# Patient Record
Sex: Male | Born: 1975
Health system: Southern US, Community
[De-identification: ages and names within clinical notes are randomized; demographics above are authoritative.]

## PROBLEM LIST (undated history)

## (undated) DIAGNOSIS — I10 Essential (primary) hypertension: Secondary | ICD-10-CM

## (undated) DIAGNOSIS — N182 Chronic kidney disease, stage 2 (mild): Secondary | ICD-10-CM

## (undated) DIAGNOSIS — F209 Schizophrenia, unspecified: Secondary | ICD-10-CM

## (undated) DIAGNOSIS — E7800 Pure hypercholesterolemia, unspecified: Secondary | ICD-10-CM

## (undated) DIAGNOSIS — R7303 Prediabetes: Secondary | ICD-10-CM

## (undated) DIAGNOSIS — E669 Obesity, unspecified: Secondary | ICD-10-CM

## (undated) HISTORY — DX: Obesity, unspecified: E66.9

## (undated) HISTORY — PX: ANKLE SURGERY: SHX546

## (undated) HISTORY — DX: Prediabetes: R73.03

## (undated) HISTORY — DX: Pure hypercholesterolemia, unspecified: E78.00

## (undated) HISTORY — PX: TESTICLE REMOVAL: SHX68

## (undated) HISTORY — DX: Chronic kidney disease, stage 2 (mild): N18.2

---

## 1997-04-30 ENCOUNTER — Ambulatory Visit (HOSPITAL_COMMUNITY): Admission: RE | Admit: 1997-04-30 | Discharge: 1997-04-30 | Payer: Self-pay | Admitting: Family Medicine

## 1999-05-27 ENCOUNTER — Encounter: Payer: Self-pay | Admitting: Emergency Medicine

## 1999-05-27 ENCOUNTER — Emergency Department (HOSPITAL_COMMUNITY): Admission: EM | Admit: 1999-05-27 | Discharge: 1999-05-27 | Payer: Self-pay | Admitting: Emergency Medicine

## 1999-11-21 ENCOUNTER — Emergency Department (HOSPITAL_COMMUNITY): Admission: EM | Admit: 1999-11-21 | Discharge: 1999-11-21 | Payer: Self-pay | Admitting: Emergency Medicine

## 2001-02-27 ENCOUNTER — Inpatient Hospital Stay (HOSPITAL_COMMUNITY): Admission: EM | Admit: 2001-02-27 | Discharge: 2001-03-03 | Payer: Self-pay | Admitting: Psychiatry

## 2009-02-17 ENCOUNTER — Emergency Department (HOSPITAL_COMMUNITY): Admission: EM | Admit: 2009-02-17 | Discharge: 2009-02-17 | Payer: Self-pay | Admitting: Emergency Medicine

## 2009-04-13 ENCOUNTER — Emergency Department (HOSPITAL_COMMUNITY): Admission: EM | Admit: 2009-04-13 | Discharge: 2009-04-13 | Payer: Self-pay | Admitting: Emergency Medicine

## 2010-04-13 LAB — BASIC METABOLIC PANEL
BUN: 6 mg/dL (ref 6–23)
CO2: 30 mEq/L (ref 19–32)
Calcium: 9.2 mg/dL (ref 8.4–10.5)
Chloride: 103 mEq/L (ref 96–112)
Creatinine, Ser: 0.99 mg/dL (ref 0.4–1.5)
GFR calc Af Amer: >60 mL/min (ref >60)
GFR calc non Af Amer: >60 mL/min (ref >60)
Glucose, Bld: 132 mg/dL — ABNORMAL HIGH (ref 70–99)
Potassium: 3.2 mEq/L — ABNORMAL LOW (ref 3.5–5.1)
Sodium: 142 mEq/L (ref 135–145)

## 2010-04-20 LAB — POCT I-STAT, CHEM 8
BUN: 7 mg/dL (ref 6–23)
Calcium, Ion: 1.06 mmol/L — ABNORMAL LOW (ref 1.12–1.32)
Chloride: 106 mEq/L (ref 96–112)
Creatinine, Ser: 1 mg/dL (ref 0.4–1.5)
Glucose, Bld: 98 mg/dL (ref 70–99)
HCT: 51 % (ref 39.0–52.0)
Hemoglobin: 17.3 g/dL — ABNORMAL HIGH (ref 13.0–17.0)
Potassium: 3.3 mEq/L — ABNORMAL LOW (ref 3.5–5.1)
Sodium: 141 mEq/L (ref 135–145)
TCO2: 26 mmol/L (ref 0–100)
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
## 2012-03-26 ENCOUNTER — Emergency Department (HOSPITAL_COMMUNITY)
Admission: EM | Admit: 2012-03-26 | Discharge: 2012-03-27 | Disposition: A | Discharge status: Home / Self Care | Payer: Medicare Other | Attending: Emergency Medicine | Admitting: Emergency Medicine

## 2012-03-26 ENCOUNTER — Encounter (HOSPITAL_COMMUNITY): Payer: Self-pay | Admitting: Emergency Medicine

## 2012-03-26 DIAGNOSIS — F419 Anxiety disorder, unspecified: Secondary | ICD-10-CM

## 2012-03-26 DIAGNOSIS — R109 Unspecified abdominal pain: Secondary | ICD-10-CM | POA: Insufficient documentation

## 2012-03-26 DIAGNOSIS — F411 Generalized anxiety disorder: Secondary | ICD-10-CM | POA: Insufficient documentation

## 2012-03-26 DIAGNOSIS — F209 Schizophrenia, unspecified: Secondary | ICD-10-CM | POA: Insufficient documentation

## 2012-03-26 DIAGNOSIS — Z79899 Other long term (current) drug therapy: Secondary | ICD-10-CM | POA: Insufficient documentation

## 2012-03-26 HISTORY — DX: Schizophrenia, unspecified: F20.9

## 2012-03-26 NOTE — ED Notes (Signed)
Pt sts has felt like like has a upset stomach when he ate some food with meat, given the pt is a vegetarian. Pt sts another [facility] client made him agitated.VSS

## 2012-03-27 NOTE — ED Provider Notes (Signed)
History     CSN: [id number]  Arrival date & time 03/26/12  2312   First MD Initiated Contact with Patient 03/26/12 2331      Chief Complaint  Patient presents with  . Agitation    (Consider location/radiation/quality/duration/timing/severity/associated sxs/prior treatment) HPI... complains of agitation and anxiety at the [employer].  Another client had made him agitated. slight upset stomach, otherwise no other somatic complaints.  Nothing makes symptoms better or worse. Severity is mild. No radiation.  Past Medical History  Diagnosis Date  . Schizophrenia     History reviewed. No pertinent past surgical history.  No family history on file.  History  Substance Use Topics  . Smoking status: Never Smoker   . Smokeless tobacco: Not on file  . Alcohol Use: No      Review of Systems  All other systems reviewed and are negative.    Allergies  Review of patient's allergies indicates no known allergies.  Home Medications   Current Outpatient Rx  Name  Route  Sig  Dispense  Refill  . amLODipine (NORVASC) 10 MG tablet   Oral   Take 10 mg by mouth daily.         . hydrochlorothiazide (HYDRODIURIL) 25 MG tablet   Oral   Take 25 mg by mouth daily.         Marland Kitchen lisinopril (PRINIVIL,ZESTRIL) 20 MG tablet   Oral   Take 20 mg by mouth daily.         . simvastatin (ZOCOR) 40 MG tablet   Oral   Take 40 mg by mouth every evening.           BP 124/77  Pulse 67  Temp(Src) 98.6 F (37 C) (Oral)  Resp 18  SpO2 100%  Physical Exam  Nursing note and vitals reviewed. Constitutional: He is oriented to person, place, and time. He appears well-developed and well-nourished.  HENT:  Head: Normocephalic and atraumatic.  Eyes: Conjunctivae and EOM are normal. Pupils are equal, round, and reactive to light.  Neck: Normal range of motion. Neck supple.  Cardiovascular: Normal rate, regular rhythm and normal heart sounds.   Pulmonary/Chest: Effort normal and  breath sounds normal.  Abdominal: Soft. Bowel sounds are normal.  Musculoskeletal: Normal range of motion.  Neurological: He is alert and oriented to person, place, and time.  Skin: Skin is warm and dry.  Psychiatric: He has a normal mood and affect.    ED Course  Procedures (including critical care time)  Labs Reviewed - No data to display No results found.   1. Anxiety       MDM  Normal exam. No further testing necessary.  Talking session        Donnetta Hutching, MD 03/27/12 [phone]

## 2013-02-28 DIAGNOSIS — I10 Essential (primary) hypertension: Secondary | ICD-10-CM | POA: Diagnosis not present

## 2013-03-07 DIAGNOSIS — I10 Essential (primary) hypertension: Secondary | ICD-10-CM | POA: Diagnosis not present

## 2013-03-07 DIAGNOSIS — E785 Hyperlipidemia, unspecified: Secondary | ICD-10-CM | POA: Diagnosis not present

## 2013-06-04 DIAGNOSIS — F209 Schizophrenia, unspecified: Secondary | ICD-10-CM | POA: Diagnosis not present

## 2013-07-11 DIAGNOSIS — F209 Schizophrenia, unspecified: Secondary | ICD-10-CM | POA: Diagnosis not present

## 2013-08-08 DIAGNOSIS — F209 Schizophrenia, unspecified: Secondary | ICD-10-CM | POA: Diagnosis not present

## 2013-08-28 DIAGNOSIS — Z7189 Other specified counseling: Secondary | ICD-10-CM | POA: Diagnosis not present

## 2013-08-28 DIAGNOSIS — E119 Type 2 diabetes mellitus without complications: Secondary | ICD-10-CM | POA: Diagnosis not present

## 2013-08-28 DIAGNOSIS — N529 Male erectile dysfunction, unspecified: Secondary | ICD-10-CM | POA: Diagnosis not present

## 2013-08-28 DIAGNOSIS — I10 Essential (primary) hypertension: Secondary | ICD-10-CM | POA: Diagnosis not present

## 2013-10-03 DIAGNOSIS — F209 Schizophrenia, unspecified: Secondary | ICD-10-CM | POA: Diagnosis not present

## 2013-12-31 DIAGNOSIS — F209 Schizophrenia, unspecified: Secondary | ICD-10-CM | POA: Diagnosis not present

## 2014-01-01 DIAGNOSIS — I10 Essential (primary) hypertension: Secondary | ICD-10-CM | POA: Diagnosis not present

## 2014-01-01 DIAGNOSIS — E785 Hyperlipidemia, unspecified: Secondary | ICD-10-CM | POA: Diagnosis not present

## 2014-01-09 ENCOUNTER — Emergency Department (HOSPITAL_COMMUNITY)
Admission: EM | Admit: 2014-01-09 | Discharge: 2014-01-09 | Disposition: A | Discharge status: Home / Self Care | Payer: Medicare Other | Attending: Emergency Medicine | Admitting: Emergency Medicine

## 2014-01-09 ENCOUNTER — Encounter (HOSPITAL_COMMUNITY): Payer: Self-pay | Admitting: Emergency Medicine

## 2014-01-09 ENCOUNTER — Emergency Department (HOSPITAL_COMMUNITY): Payer: Medicare Other

## 2014-01-09 DIAGNOSIS — Q550 Absence and aplasia of testis: Secondary | ICD-10-CM | POA: Diagnosis not present

## 2014-01-09 DIAGNOSIS — F419 Anxiety disorder, unspecified: Secondary | ICD-10-CM | POA: Insufficient documentation

## 2014-01-09 DIAGNOSIS — I10 Essential (primary) hypertension: Secondary | ICD-10-CM | POA: Diagnosis not present

## 2014-01-09 DIAGNOSIS — K4090 Unilateral inguinal hernia, without obstruction or gangrene, not specified as recurrent: Secondary | ICD-10-CM | POA: Diagnosis not present

## 2014-01-09 DIAGNOSIS — Z79899 Other long term (current) drug therapy: Secondary | ICD-10-CM | POA: Diagnosis not present

## 2014-01-09 DIAGNOSIS — R350 Frequency of micturition: Secondary | ICD-10-CM | POA: Diagnosis not present

## 2014-01-09 DIAGNOSIS — N508 Other specified disorders of male genital organs: Secondary | ICD-10-CM | POA: Diagnosis not present

## 2014-01-09 DIAGNOSIS — N50819 Testicular pain, unspecified: Secondary | ICD-10-CM

## 2014-01-09 DIAGNOSIS — R202 Paresthesia of skin: Secondary | ICD-10-CM | POA: Diagnosis not present

## 2014-01-09 HISTORY — DX: Essential (primary) hypertension: I10

## 2014-01-09 LAB — BASIC METABOLIC PANEL
Anion gap: 14 (ref 5–15)
BUN: 12 mg/dL (ref 6–23)
CALCIUM: 10 mg/dL (ref 8.4–10.5)
CO2: 26 meq/L (ref 19–32)
Chloride: 100 mEq/L (ref 96–112)
Creatinine, Ser: 1.04 mg/dL (ref 0.50–1.35)
GFR calc Af Amer: >90 mL/min (ref >90)
GFR calc non Af Amer: >90 mL/min (ref >90)
GLUCOSE: 96 mg/dL (ref 70–99)
POTASSIUM: 3.7 meq/L (ref 3.7–5.3)
Sodium: 140 mEq/L (ref 137–147)

## 2014-01-09 LAB — CBC WITH DIFFERENTIAL/PLATELET
Basophils Absolute: 0 10*3/uL (ref 0.0–0.1)
Basophils Relative: 0 % (ref 0–1)
EOS PCT: 2 % (ref 0–5)
Eosinophils Absolute: 0.1 10*3/uL (ref 0.0–0.7)
HCT: 47.3 % (ref 39.0–52.0)
HEMOGLOBIN: 15.8 g/dL (ref 13.0–17.0)
LYMPHS ABS: 2.6 10*3/uL (ref 0.7–4.0)
LYMPHS PCT: 46 % (ref 12–46)
MCH: 29.7 pg (ref 26.0–34.0)
MCHC: 33.4 g/dL (ref 30.0–36.0)
MCV: 88.9 fL (ref 78.0–100.0)
Monocytes Absolute: 0.5 10*3/uL (ref 0.1–1.0)
Monocytes Relative: 9 % (ref 3–12)
Neutro Abs: 2.5 10*3/uL (ref 1.7–7.7)
Neutrophils Relative %: 43 % (ref 43–77)
Platelets: 249 10*3/uL (ref 150–400)
RBC: 5.32 MIL/uL (ref 4.22–5.81)
RDW: 13.8 % (ref 11.5–15.5)
WBC: 5.8 10*3/uL (ref 4.0–10.5)

## 2014-01-09 MED ORDER — SODIUM CHLORIDE 0.9 % IV BOLUS (SEPSIS)
1000.0000 mL | Freq: Once | INTRAVENOUS | Status: AC
  Administered 2014-01-09: 1000 mL via INTRAVENOUS

## 2014-01-09 MED ORDER — MORPHINE SULFATE 4 MG/ML IJ SOLN
4.0000 mg | Freq: Once | INTRAMUSCULAR | Status: AC
  Administered 2014-01-09: 4 mg via INTRAVENOUS

## 2014-01-09 MED ORDER — MORPHINE SULFATE 4 MG/ML IJ SOLN
4.0000 mg | Freq: Once | INTRAMUSCULAR | Status: DC
  Filled 2014-01-09: qty 1

## 2014-01-09 NOTE — Discharge Instructions (Signed)
PLEASE DRIVE YOURSELF OVER TO DR. DAHLSTEAD'S OFFICE IMMEDIATELY TO BE SEEN. Please read all discharge instructions and return precautions.   Testicular Torsion Testicular torsion is a twisting of the spermatic cord, artery, and vein that go to the testicle. This twisting cuts off the blood supply to everything in the sac that contains the testes, blood vessels, and part of the spermatic cord (scrotum). Testicular torsion is most commonly seen in newborn and adolescent males. It can also occur before birth. Testicular torsion requires emergency treatment. The testicle usually can be saved if the torsion is treated within 6 hours of onset. If the torsion is left untreated for too long, the testicle will die and have to be removed.  CAUSES  Torsion can be caused by a hit on the scrotum or by certain movements during exercise. In some males, testicular torsion is more common because the connection of their testicle to a specific tissue in their scrotum developed in the wrong place, allowing the testicle to rotate and the cord to get twisted.  SIGNS AND SYMPTOMS  The main symptom of testicular torsion is pain in your testicle. The scrotum may be swollen, red, hard, and very tender. There will be excess fluid in the tissue (edema).The testicle may be higher than normal in the scrotum. The skin of the scrotum may be stuck to the testicle. You may have nausea, vomiting, and a fever. DIAGNOSIS  Often testicular torsion is diagnosed through a physical exam. Sometimes imaging exams and tests to measure blood flow may be done. TREATMENT  A manual untwisting of the testicle may be done when the testicle is still mobile and the maneuver is not too painful. However, surgery usually is necessary and should be done as soon as possible after torsion occurs. During surgery, the testicle is untwisted and evaluated and possibly removed.  Document Released: 01/11/2005 Document Revised: 01/16/2013 Document Reviewed:  07/03/2012 [facility] Patient Information 2015 [employer] [place]. This information is not intended to replace advice given to you by your health care provider. Make sure you discuss any questions you have with your health care provider.

## 2014-01-09 NOTE — ED Notes (Signed)
Pt transported to US

## 2014-01-09 NOTE — ED Provider Notes (Signed)
CSN: [id number]     Arrival date & time 01/09/14  0810 History   First MD Initiated Contact with Patient 01/09/14 [phone]     Chief Complaint  Patient presents with  . Testicle Pain     (Consider location/radiation/quality/duration/timing/severity/associated sxs/prior Treatment) HPI Comments: Patient is a 38 year old male past medical history significant for schizophrenia, hypertension presenting to the emergency department complaining of left testicular pain. He states began around 7:45 AM this morning after doing pushups. He states it felt like his testicle "went back inside of him." He states he took a shower to try to help the pain with no improvement. He states he had his right testicle removed 20 years ago and is concerned this may happen to his left testicle. No modifying factors identified. He does endorse urinary frequency, denies any dysuria, hematuria, penile discharge, recent unprotected sexual intercourse or concern for sexual transmitted diseases.  Patient is a 38 y.o. male presenting with testicular pain.  Testicle Pain    Past Medical History  Diagnosis Date  . Schizophrenia   . Hypertension    Past Surgical History  Procedure Laterality Date  . Testicle removal      Right  . Ankle surgery      left   No family history on file. History  Substance Use Topics  . Smoking status: Never Smoker   . Smokeless tobacco: Not on file  . Alcohol Use: No    Review of Systems  Genitourinary: Positive for frequency and testicular pain.  All other systems reviewed and are negative.     Allergies  Review of patient's allergies indicates no known allergies.  Home Medications   Prior to Admission medications   Medication Sig Start Date End Date Taking? Authorizing Provider  amLODipine (NORVASC) 10 MG tablet Take 10 mg by mouth daily.    Historical Provider, MD  hydrochlorothiazide (HYDRODIURIL) 25 MG tablet Take 25 mg by mouth daily.    Historical Provider, MD   lisinopril (PRINIVIL,ZESTRIL) 20 MG tablet Take 20 mg by mouth daily.    Historical Provider, MD  simvastatin (ZOCOR) 40 MG tablet Take 40 mg by mouth every evening.    Historical Provider, MD   BP 127/78 mmHg  Pulse 87  Temp(Src) 99.1 F (37.3 C) (Oral)  Resp 24  Ht 5\' 8"  (1.727 m)  Wt 240 lb (108.863 kg)  BMI 36.50 kg/m2  SpO2 100% Physical Exam  Constitutional: He is oriented to person, place, and time. He appears well-developed and well-nourished. No distress.  HENT:  Head: Normocephalic and atraumatic.  Right Ear: External ear normal.  Left Ear: External ear normal.  Nose: Nose normal.  Mouth/Throat: Oropharynx is clear and moist.  Eyes: Conjunctivae are normal.  Neck: Normal range of motion. Neck supple.  Cardiovascular: Normal rate, regular rhythm and normal heart sounds.   Pulmonary/Chest: Effort normal and breath sounds normal.  Abdominal: Soft.  Genitourinary: Penis normal. Left testis shows no mass, no swelling and no tenderness. Left testis is descended. Circumcised.  Right testicle absent (s/p removal)  Musculoskeletal: Normal range of motion.  Lymphadenopathy:       Right: No inguinal adenopathy present.       Left: No inguinal adenopathy present.  Neurological: He is alert and oriented to person, place, and time.  Skin: Skin is warm and dry. He is not diaphoretic.  Psychiatric: His mood appears anxious.  Nursing note and vitals reviewed.   ED Course  Procedures (including critical care time) Medications  morphine 4  MG/ML injection 4 mg (4 mg Intravenous Given 01/09/14 0827)    Labs Review Labs Reviewed - No data to display  Imaging Review No results found.   EKG Interpretation None      10:11 AM Discussed patient with Dr. Lenn Sinkahlsteadt who will see patient in his office today. Will send patient to his office.    MDM   Final diagnoses:  Testicular pain    Filed Vitals:   01/09/14 1025  BP: 153/87  Pulse: 84  Temp: 98.9 F (37.2 C)   Resp: 20   Afebrile, NAD, non-toxic appearing, AAOx4.  I have reviewed nursing notes, vital signs, and all appropriate lab and imaging results for this patient. Symptoms controlled in the ED. Patient discharged to [facility] for elevation of possible torsion de-torsion of his left testicle given history, physical examination. Patient is agreeable to this plan. Patient d/w with Dr. Patria Maneampos, agrees with plan.      Jeannetta EllisJennifer L Kindell Strada, PA-C 01/09/14 1601  Lyanne CoKevin M Campos, MD 01/09/14 1630

## 2014-01-09 NOTE — ED Notes (Signed)
EMS - Patient is coming from home with c/o of left testicular pain.  Pain started around 07:45 after doing push ups.

## 2014-01-09 NOTE — ED Notes (Signed)
Patient verbalized understanding of discharge instructions and the need for immediate follow up care after discharge to urology.

## 2014-01-09 NOTE — ED Notes (Signed)
This RN had to go to Ultrasound and assist staff in getting patient to respond and provide verbal consent for ultrasound examination.  Patient acknowledged this RN and verbalized understanding of the procedure with US technician at bedside.  Patient is alert and responding to verbal command.

## 2014-01-29 DIAGNOSIS — F209 Schizophrenia, unspecified: Secondary | ICD-10-CM | POA: Diagnosis not present

## 2014-02-21 DIAGNOSIS — F209 Schizophrenia, unspecified: Secondary | ICD-10-CM | POA: Diagnosis not present

## 2014-04-17 DIAGNOSIS — Z789 Other specified health status: Secondary | ICD-10-CM | POA: Diagnosis not present

## 2014-04-17 DIAGNOSIS — N521 Erectile dysfunction due to diseases classified elsewhere: Secondary | ICD-10-CM | POA: Diagnosis not present

## 2014-04-17 DIAGNOSIS — I10 Essential (primary) hypertension: Secondary | ICD-10-CM | POA: Diagnosis not present

## 2014-04-18 DIAGNOSIS — F209 Schizophrenia, unspecified: Secondary | ICD-10-CM | POA: Diagnosis not present

## 2014-07-09 DIAGNOSIS — F209 Schizophrenia, unspecified: Secondary | ICD-10-CM | POA: Diagnosis not present

## 2014-07-09 DIAGNOSIS — I10 Essential (primary) hypertension: Secondary | ICD-10-CM | POA: Diagnosis not present

## 2014-07-09 DIAGNOSIS — E780 Pure hypercholesterolemia: Secondary | ICD-10-CM | POA: Diagnosis not present

## 2014-08-27 DIAGNOSIS — I10 Essential (primary) hypertension: Secondary | ICD-10-CM | POA: Diagnosis not present

## 2014-08-27 DIAGNOSIS — F209 Schizophrenia, unspecified: Secondary | ICD-10-CM | POA: Diagnosis not present

## 2014-08-27 DIAGNOSIS — Z1389 Encounter for screening for other disorder: Secondary | ICD-10-CM | POA: Diagnosis not present

## 2014-08-27 DIAGNOSIS — Z0000 Encounter for general adult medical examination without abnormal findings: Secondary | ICD-10-CM | POA: Diagnosis not present

## 2014-08-27 DIAGNOSIS — E780 Pure hypercholesterolemia: Secondary | ICD-10-CM | POA: Diagnosis not present

## 2014-08-27 DIAGNOSIS — Z125 Encounter for screening for malignant neoplasm of prostate: Secondary | ICD-10-CM | POA: Diagnosis not present

## 2014-10-11 DIAGNOSIS — F209 Schizophrenia, unspecified: Secondary | ICD-10-CM | POA: Diagnosis not present

## 2014-10-13 DIAGNOSIS — Z23 Encounter for immunization: Secondary | ICD-10-CM | POA: Diagnosis not present

## 2015-01-29 DIAGNOSIS — F209 Schizophrenia, unspecified: Secondary | ICD-10-CM | POA: Diagnosis not present

## 2015-03-03 DIAGNOSIS — F209 Schizophrenia, unspecified: Secondary | ICD-10-CM | POA: Diagnosis not present

## 2015-03-03 DIAGNOSIS — Z6841 Body Mass Index (BMI) 40.0 and over, adult: Secondary | ICD-10-CM | POA: Diagnosis not present

## 2015-03-03 DIAGNOSIS — I10 Essential (primary) hypertension: Secondary | ICD-10-CM | POA: Diagnosis not present

## 2015-03-03 DIAGNOSIS — E669 Obesity, unspecified: Secondary | ICD-10-CM | POA: Diagnosis not present

## 2015-03-03 DIAGNOSIS — E7800 Pure hypercholesterolemia, unspecified: Secondary | ICD-10-CM | POA: Diagnosis not present

## 2015-04-05 IMAGING — US US SCROTUM
1 series · 13 of 23 positions shown · non-contrast
Comparison: None.

CLINICAL DATA: 38-year-old male with acute testicular pain
beginning at 8003 hr today. Personal history of right orchidectomy.
Initial encounter.

EXAM:
SCROTAL ULTRASOUND
DOPPLER ULTRASOUND OF THE TESTICLES
TECHNIQUE: Complete ultrasound examination of the testicles, epididymis, and
other scrotal structures was performed. Color and spectral Doppler
ultrasound were also utilized to evaluate blood flow to the
testicles.

[Series 1: us scrotum · 0.06mm/px · 13 of 23 slices shown]
[im 1/23]
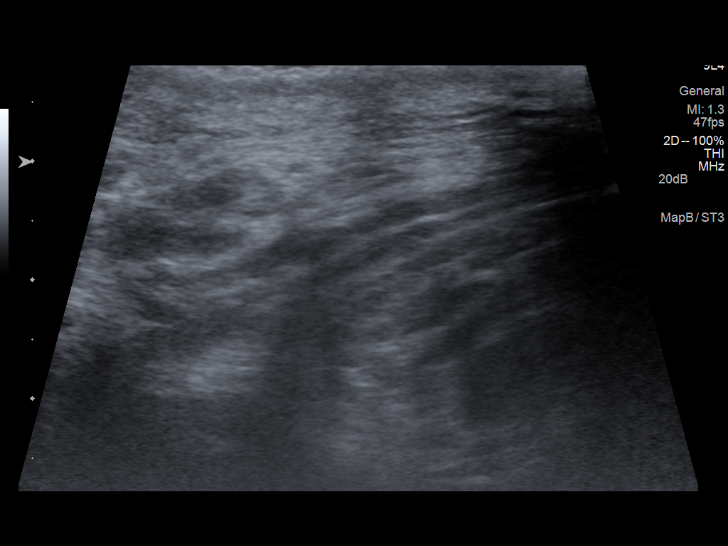
[im 3/23]
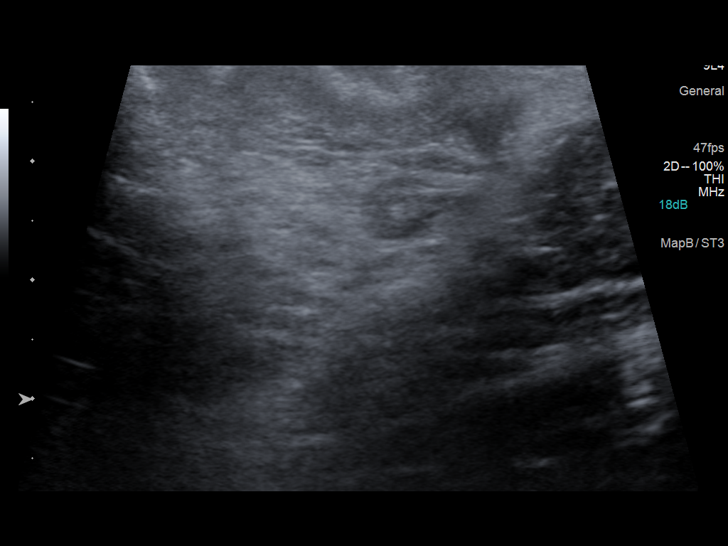
[im 5/23]
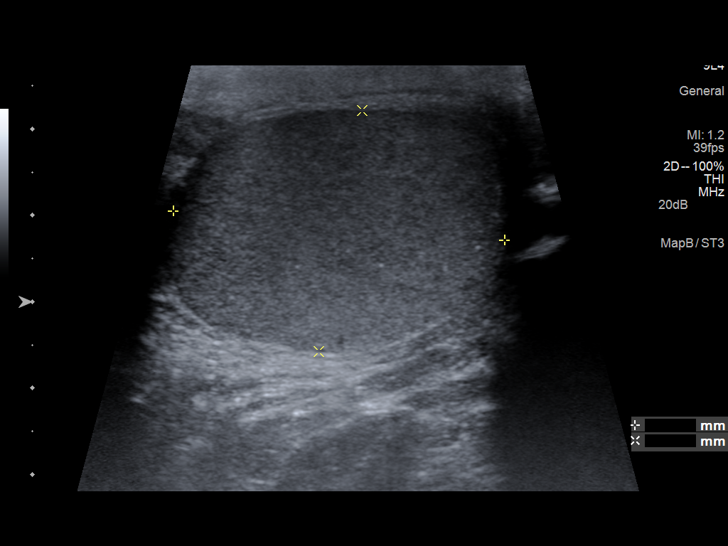
[im 7/23]
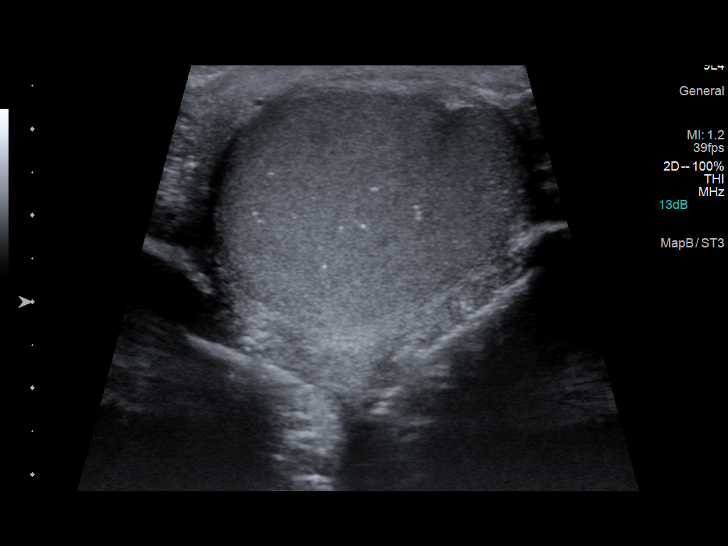
[im 8/23]
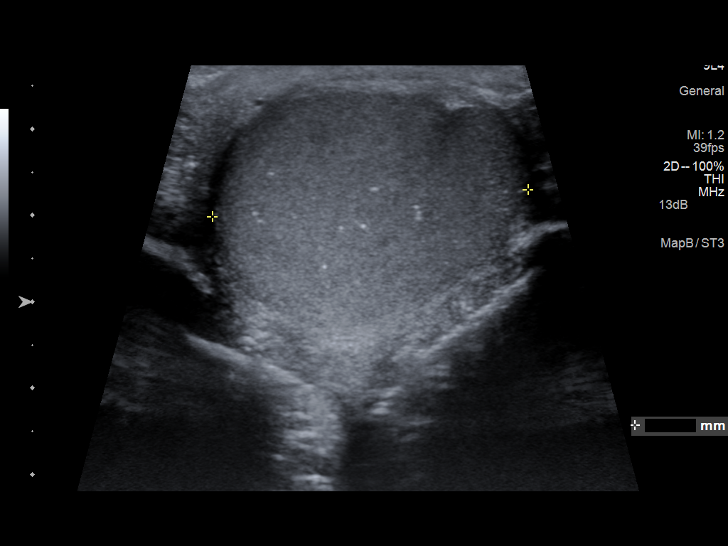
[im 10/23]
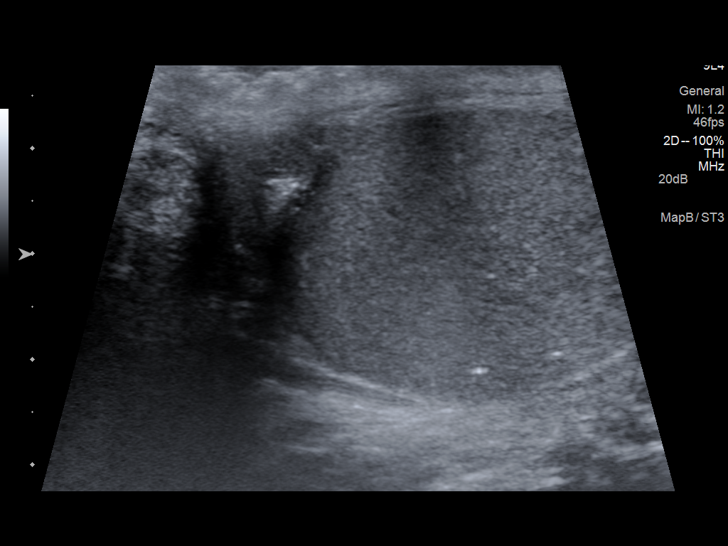
[im 12/23]
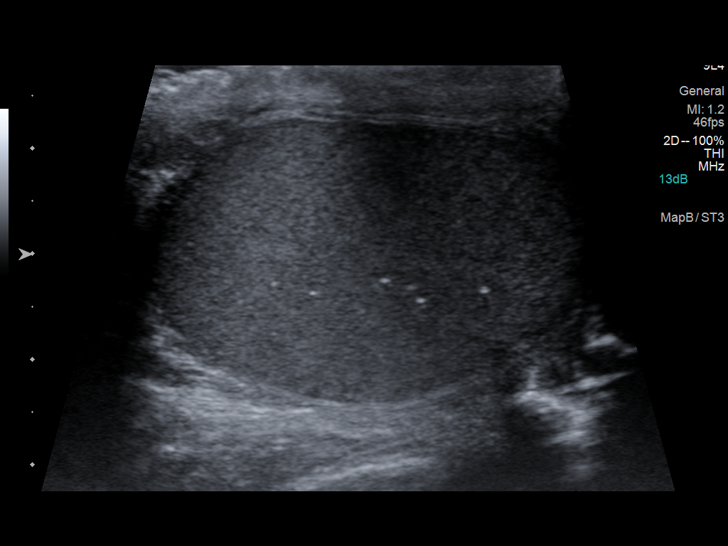
[im 14/23]
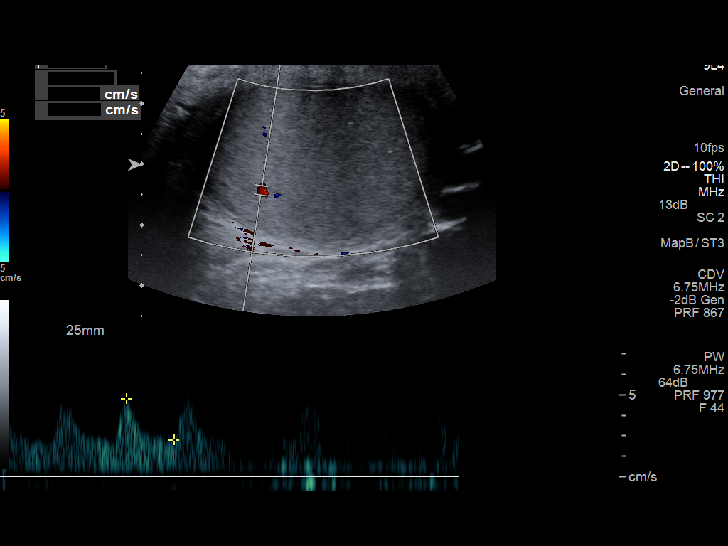
[im 16/23]
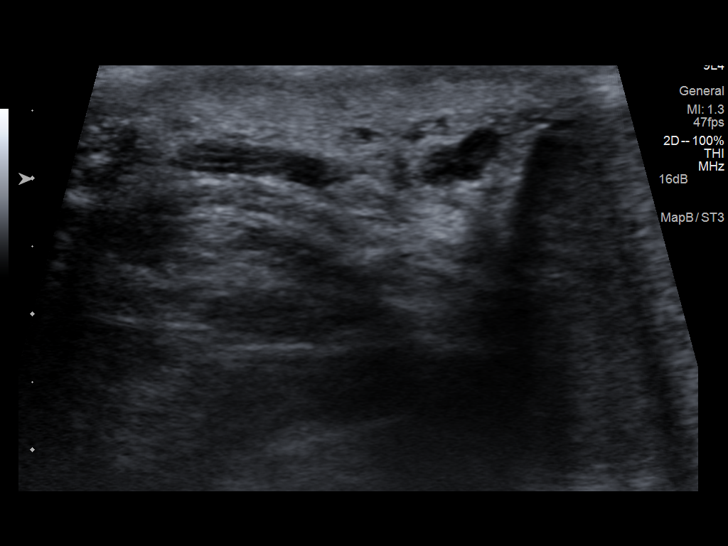
[im 17/23]
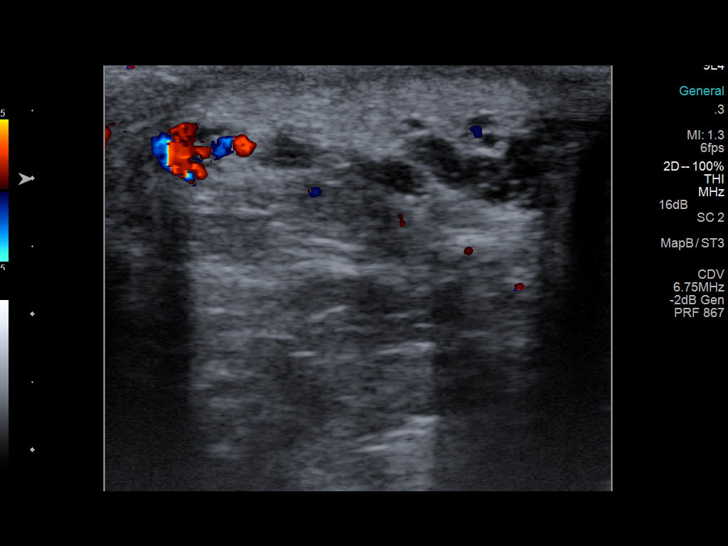
[im 19/23]
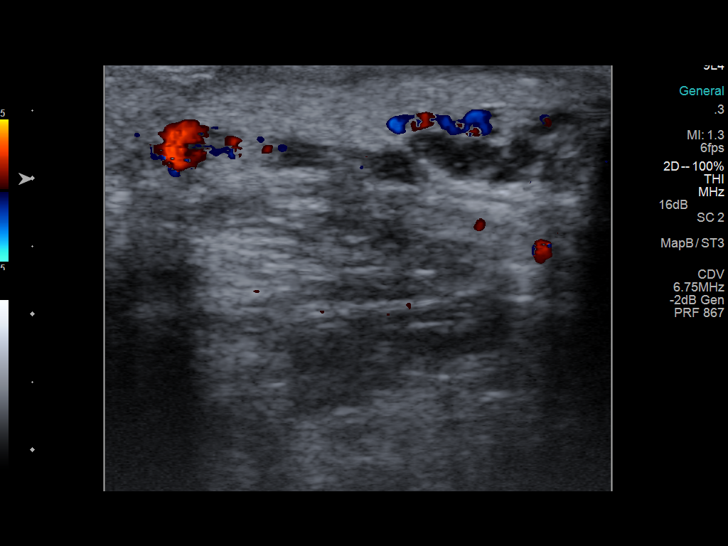
[im 21/23]
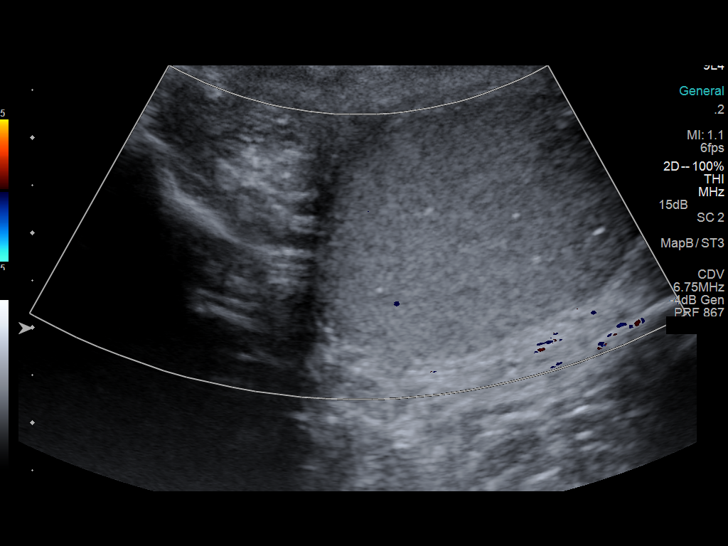
[im 23/23]
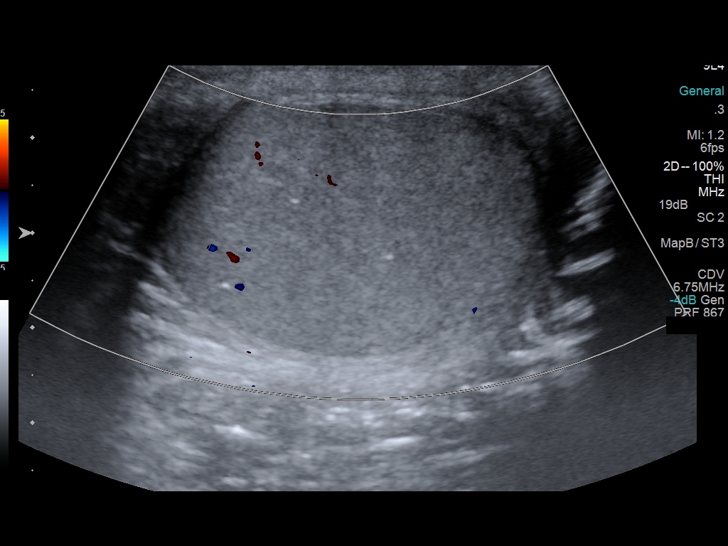

[13 of 23 positions shown; findings below may reference images not displayed]

FINDINGS: Right testicle

Measurements: Surgically absent.

Left testicle

Measurements: 3.9 x 2.8 x 3.7 cm. No testicular mass, but there is
microlithiasis.

Right epididymis:  Surgically absent

Left epididymis:  Normal in size and appearance.

Hydrocele:  None visualized.

Varicocele:  None visualized.

Pulsed Doppler interrogation of the left testis. Low resistance
arterial and venous waveforms are detected in the left testis
(images 15 and 16). However, color Doppler images of the left testis
suggest somewhat decreased vascularity (images 22 and 24)
IMPRESSION: 1. Spectral Doppler waveforms are detected in the left testis, but
color Doppler images suggest less than expected vascularity overall
(however, there is no right testicle for comparison). Consider acute
torsion - de-torsion and recommend urology consultation
2. Left microlithiasis.
3. Surgically absent right testicle.
Study discussed by telephone with PA KIVEN TENN on

## 2015-04-21 DIAGNOSIS — F209 Schizophrenia, unspecified: Secondary | ICD-10-CM | POA: Diagnosis not present

## 2015-08-06 DIAGNOSIS — F209 Schizophrenia, unspecified: Secondary | ICD-10-CM | POA: Diagnosis not present

## 2015-10-20 DIAGNOSIS — E7800 Pure hypercholesterolemia, unspecified: Secondary | ICD-10-CM | POA: Diagnosis not present

## 2015-10-20 DIAGNOSIS — Z1389 Encounter for screening for other disorder: Secondary | ICD-10-CM | POA: Diagnosis not present

## 2015-10-20 DIAGNOSIS — F209 Schizophrenia, unspecified: Secondary | ICD-10-CM | POA: Diagnosis not present

## 2015-10-20 DIAGNOSIS — Z23 Encounter for immunization: Secondary | ICD-10-CM | POA: Diagnosis not present

## 2015-10-20 DIAGNOSIS — Z6841 Body Mass Index (BMI) 40.0 and over, adult: Secondary | ICD-10-CM | POA: Diagnosis not present

## 2015-10-20 DIAGNOSIS — I10 Essential (primary) hypertension: Secondary | ICD-10-CM | POA: Diagnosis not present

## 2015-10-20 DIAGNOSIS — Z0000 Encounter for general adult medical examination without abnormal findings: Secondary | ICD-10-CM | POA: Diagnosis not present

## 2016-01-02 DIAGNOSIS — R799 Abnormal finding of blood chemistry, unspecified: Secondary | ICD-10-CM | POA: Diagnosis not present

## 2016-02-13 DIAGNOSIS — R319 Hematuria, unspecified: Secondary | ICD-10-CM | POA: Diagnosis not present

## 2016-02-19 DIAGNOSIS — F209 Schizophrenia, unspecified: Secondary | ICD-10-CM | POA: Diagnosis not present

## 2016-06-03 DIAGNOSIS — F209 Schizophrenia, unspecified: Secondary | ICD-10-CM | POA: Diagnosis not present

## 2016-06-04 DIAGNOSIS — E7800 Pure hypercholesterolemia, unspecified: Secondary | ICD-10-CM | POA: Diagnosis not present

## 2016-06-04 DIAGNOSIS — I10 Essential (primary) hypertension: Secondary | ICD-10-CM | POA: Diagnosis not present

## 2016-06-04 DIAGNOSIS — Z6841 Body Mass Index (BMI) 40.0 and over, adult: Secondary | ICD-10-CM | POA: Diagnosis not present

## 2016-06-04 DIAGNOSIS — R7309 Other abnormal glucose: Secondary | ICD-10-CM | POA: Diagnosis not present

## 2016-06-04 DIAGNOSIS — F258 Other schizoaffective disorders: Secondary | ICD-10-CM | POA: Diagnosis not present

## 2016-11-10 DIAGNOSIS — I10 Essential (primary) hypertension: Secondary | ICD-10-CM | POA: Diagnosis not present

## 2016-11-10 DIAGNOSIS — E7800 Pure hypercholesterolemia, unspecified: Secondary | ICD-10-CM | POA: Diagnosis not present

## 2016-11-10 DIAGNOSIS — R7303 Prediabetes: Secondary | ICD-10-CM | POA: Diagnosis not present

## 2016-11-10 DIAGNOSIS — F209 Schizophrenia, unspecified: Secondary | ICD-10-CM | POA: Diagnosis not present

## 2016-11-10 DIAGNOSIS — Z23 Encounter for immunization: Secondary | ICD-10-CM | POA: Diagnosis not present

## 2016-12-14 DIAGNOSIS — I10 Essential (primary) hypertension: Secondary | ICD-10-CM | POA: Diagnosis not present

## 2016-12-14 DIAGNOSIS — E785 Hyperlipidemia, unspecified: Secondary | ICD-10-CM | POA: Diagnosis not present

## 2016-12-14 DIAGNOSIS — F29 Unspecified psychosis not due to a substance or known physiological condition: Secondary | ICD-10-CM | POA: Diagnosis not present

## 2016-12-14 DIAGNOSIS — F209 Schizophrenia, unspecified: Secondary | ICD-10-CM | POA: Diagnosis present

## 2016-12-14 DIAGNOSIS — F2089 Other schizophrenia: Secondary | ICD-10-CM | POA: Diagnosis not present

## 2016-12-14 DIAGNOSIS — E876 Hypokalemia: Secondary | ICD-10-CM | POA: Diagnosis not present

## 2016-12-14 DIAGNOSIS — Z9114 Patient's other noncompliance with medication regimen: Secondary | ICD-10-CM | POA: Diagnosis not present

## 2016-12-14 DIAGNOSIS — Z590 Homelessness: Secondary | ICD-10-CM | POA: Diagnosis not present

## 2016-12-14 DIAGNOSIS — E538 Deficiency of other specified B group vitamins: Secondary | ICD-10-CM | POA: Diagnosis present

## 2017-02-22 DIAGNOSIS — F209 Schizophrenia, unspecified: Secondary | ICD-10-CM | POA: Diagnosis not present

## 2017-02-25 DIAGNOSIS — E7800 Pure hypercholesterolemia, unspecified: Secondary | ICD-10-CM | POA: Diagnosis not present

## 2017-02-25 DIAGNOSIS — I10 Essential (primary) hypertension: Secondary | ICD-10-CM | POA: Diagnosis not present

## 2017-02-25 DIAGNOSIS — R7303 Prediabetes: Secondary | ICD-10-CM | POA: Diagnosis not present

## 2017-02-25 DIAGNOSIS — Z1389 Encounter for screening for other disorder: Secondary | ICD-10-CM | POA: Diagnosis not present

## 2017-02-25 DIAGNOSIS — F209 Schizophrenia, unspecified: Secondary | ICD-10-CM | POA: Diagnosis not present

## 2017-03-17 DIAGNOSIS — F209 Schizophrenia, unspecified: Secondary | ICD-10-CM | POA: Diagnosis not present

## 2017-05-13 DIAGNOSIS — F209 Schizophrenia, unspecified: Secondary | ICD-10-CM | POA: Diagnosis not present

## 2017-09-07 ENCOUNTER — Ambulatory Visit: Payer: Medicare Other | Admitting: Podiatry

## 2017-09-14 ENCOUNTER — Encounter: Payer: Self-pay | Admitting: Podiatry

## 2017-09-14 ENCOUNTER — Ambulatory Visit (INDEPENDENT_AMBULATORY_CARE_PROVIDER_SITE_OTHER): Payer: Medicare Other | Admitting: Podiatry

## 2017-09-14 VITALS — BP 141/92 | HR 100

## 2017-09-14 DIAGNOSIS — B351 Tinea unguium: Secondary | ICD-10-CM | POA: Diagnosis not present

## 2017-09-14 MED ORDER — TERBINAFINE HCL 250 MG PO TABS: 250.0000 mg | ORAL_TABLET | Freq: Every day | ORAL | 0 refills | Status: DC

## 2017-09-18 NOTE — Progress Notes (Signed)
   Subjective: 42 year old male presenting today as a new patient with a chief complaint of nail fungus of the first and second toes of bilateral feet that has been present for the past 3-5 years. He states he has tried several OTC products with no relief of the symptoms. He denies modifying factors. Patient is here for further evaluation and treatment.   Past Medical History:  Diagnosis Date  . Hypertension   . Schizophrenia (HCC)     Objective: Physical Exam General: The patient is alert and oriented x3 in no acute distress.  Dermatology: Hyperkeratotic, discolored, thickened, onychodystrophy of the first and second toenails noted bilaterally. Skin is warm, dry and supple bilateral lower extremities. Negative for open lesions or macerations.  Vascular: Palpable pedal pulses bilaterally. No edema or erythema noted. Capillary refill within normal limits.  Neurological: Epicritic and protective threshold grossly intact bilaterally.   Musculoskeletal Exam: Range of motion within normal limits to all pedal and ankle joints bilateral. Muscle strength 5/5 in all groups bilateral.   Assessment: #1 Onychomycosis 1st and 2nd toenails bilaterally   Plan of Care:  #1 Patient was evaluated. #2 Prescription for Lamisil 250 mg #90 provided to patient.  #3 Appointment with Shanda BumpsJessica for laser treatment.  #4 Return to clinic as needed.    Felecia ShellingBrent M. Darlin Stenseth, DPM [facility]  Dr. Felecia ShellingBrent M. Chrisanne Loose, DPM    [address]                                        [place], [place] [address]                Office [phone]  Fax [phone]

## 2017-10-19 DIAGNOSIS — F209 Schizophrenia, unspecified: Secondary | ICD-10-CM | POA: Diagnosis not present

## 2017-10-22 ENCOUNTER — Other Ambulatory Visit: Payer: Self-pay

## 2017-10-22 ENCOUNTER — Emergency Department (HOSPITAL_COMMUNITY)
Admission: EM | Admit: 2017-10-22 | Discharge: 2017-10-23 | Disposition: A | Discharge status: Home / Self Care | Payer: Medicare Other | Attending: Emergency Medicine | Admitting: Emergency Medicine

## 2017-10-22 DIAGNOSIS — M79642 Pain in left hand: Secondary | ICD-10-CM | POA: Insufficient documentation

## 2017-10-22 DIAGNOSIS — Z5321 Procedure and treatment not carried out due to patient leaving prior to being seen by health care provider: Secondary | ICD-10-CM | POA: Insufficient documentation
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
## 2018-12-08 ENCOUNTER — Ambulatory Visit: Payer: Medicare Other | Admitting: Cardiology

## 2018-12-08 NOTE — Progress Notes (Deleted)
Cardiology Office Note:    Date:  12/08/2018   ID:  Pam Drown, DOB 1975/07/10, MRN [id number]  PCP:  Georgann Housekeeper, MD  Cardiologist:  No primary care provider on file.  Electrophysiologist:  None   Referring MD: Georgann Housekeeper, MD   No chief complaint on file. ***  History of Present Illness:    Eddie Simmons is a 43 y.o. male with a hx of ***  Past Medical History:  Diagnosis Date  . Hypertension   . Schizophrenia [facility])     Past Surgical History:  Procedure Laterality Date  . ANKLE SURGERY     left  . TESTICLE REMOVAL     Right    Current Medications: No outpatient medications have been marked as taking for the 12/08/18 encounter (Appointment) with Jake Bathe, MD.     Allergies:   Patient has no known allergies.   Social History   Socioeconomic History  . Marital status: Single    Spouse name: Not on file  . Number of children: Not on file  . Years of education: Not on file  . Highest education level: Not on file  Occupational History  . Not on file  Social Needs  . Financial resource strain: Not on file  . Food insecurity    Worry: Not on file    Inability: Not on file  . Transportation needs    Medical: Not on file    Non-medical: Not on file  Tobacco Use  . Smoking status: Never Smoker  . Smokeless tobacco: Never Used  Substance and Sexual Activity  . Alcohol use: No  . Drug use: No  . Sexual activity: Not on file  Lifestyle  . Physical activity    Days per week: Not on file    Minutes per session: Not on file  . Stress: Not on file  Relationships  . Social Musician on phone: Not on file    Gets together: Not on file    Attends religious service: Not on file    Active member of club or organization: Not on file    Attends meetings of clubs or organizations: Not on file    Relationship status: Not on file  Other Topics Concern  . Not on file  Social History Narrative  . Not on file     Family History: The  patient's ***family history is not on file.  ROS:   Please see the history of present illness.    *** All other systems reviewed and are negative.  EKGs/Labs/Other Studies Reviewed:    The following studies were reviewed today: ***  EKG:  EKG is *** ordered today.  The ekg ordered today demonstrates ***  Recent Labs: No results found for requested labs within last 8760 hours.  Recent Lipid Panel No results found for: CHOL, TRIG, HDL, CHOLHDL, VLDL, LDLCALC, LDLDIRECT  Physical Exam:    VS:  There were no vitals taken for this visit.    Wt Readings from Last 3 Encounters:  10/22/17 270 lb (122.5 kg)  01/09/14 240 lb (108.9 kg)     GEN: *** Well nourished, well developed in no acute distress HEENT: Normal NECK: No JVD; No carotid bruits LYMPHATICS: No lymphadenopathy CARDIAC: ***RRR, no murmurs, rubs, gallops RESPIRATORY:  Clear to auscultation without rales, wheezing or rhonchi  ABDOMEN: Soft, non-tender, non-distended MUSCULOSKELETAL:  No edema; No deformity  SKIN: Warm and dry NEUROLOGIC:  Alert and oriented x 3 PSYCHIATRIC:  Normal affect  ASSESSMENT:    No diagnosis found. PLAN:    In order of problems listed above:  1. ***   Medication Adjustments/Labs and Tests Ordered: Current medicines are reviewed at length with the patient today.  Concerns regarding medicines are outlined above.  No orders of the defined types were placed in this encounter.  No orders of the defined types were placed in this encounter.   There are no Patient Instructions on file for this visit.   Signed, Candee Furbish, MD  12/08/2018 10:23 AM    [facility]

## 2019-01-24 ENCOUNTER — Other Ambulatory Visit: Payer: Self-pay | Admitting: *Deleted

## 2019-01-24 ENCOUNTER — Encounter: Payer: Self-pay | Admitting: *Deleted

## 2019-02-02 ENCOUNTER — Encounter: Payer: Self-pay | Admitting: Cardiology

## 2019-02-02 ENCOUNTER — Ambulatory Visit (INDEPENDENT_AMBULATORY_CARE_PROVIDER_SITE_OTHER): Payer: Medicare Other | Admitting: Cardiology

## 2019-02-02 ENCOUNTER — Other Ambulatory Visit: Payer: Self-pay

## 2019-02-02 VITALS — BP 130/80 | HR 98 | Ht 68.0 in | Wt 278.0 lb

## 2019-02-02 DIAGNOSIS — Z720 Tobacco use: Secondary | ICD-10-CM | POA: Diagnosis not present

## 2019-02-02 DIAGNOSIS — Z79899 Other long term (current) drug therapy: Secondary | ICD-10-CM | POA: Diagnosis not present

## 2019-02-02 DIAGNOSIS — I10 Essential (primary) hypertension: Secondary | ICD-10-CM | POA: Insufficient documentation

## 2019-02-02 LAB — BASIC METABOLIC PANEL
BUN/Creatinine Ratio: 7 — ABNORMAL LOW (ref 9–20)
BUN: 7 mg/dL (ref 6–24)
CO2: 25 mmol/L (ref 20–29)
Calcium: 9.5 mg/dL (ref 8.7–10.2)
Chloride: 104 mmol/L (ref 96–106)
Creatinine, Ser: 1 mg/dL (ref 0.76–1.27)
GFR calc Af Amer: 108 mL/min/{1.73_m2} (ref >59)
GFR calc non Af Amer: 93 mL/min/{1.73_m2} (ref >59)
Glucose: 89 mg/dL (ref 65–99)
Potassium: 3.7 mmol/L (ref 3.5–5.2)
Sodium: 144 mmol/L (ref 134–144)

## 2019-02-02 MED ORDER — ROSUVASTATIN CALCIUM 20 MG PO TABS: 20.0000 mg | ORAL_TABLET | Freq: Every day | ORAL | 3 refills | Status: DC

## 2019-02-02 MED ORDER — SPIRONOLACTONE 25 MG PO TABS: 25.0000 mg | ORAL_TABLET | Freq: Every day | ORAL | 3 refills | Status: DC

## 2019-02-02 NOTE — Patient Instructions (Signed)
Medication Instructions:  Please discontinue you Simvastatin and start Crestor 20 mg a day.  Start Spironolactone 25 mg a day.  Continue all other mediations as listed.  *If you need a refill on your cardiac medications before your next appointment, please call your pharmacy*  Lab Work: Please have blood work today and again in 1 week. (BMP)  If you have labs (blood work) drawn today and your tests are completely normal, you will receive your results only by: Marland Kitchen MyChart Message (if you have MyChart) OR . A paper copy in the mail If you have any lab test that is abnormal or we need to change your treatment, we will call you to review the results.  You have been referred to our Hypertension Clinic.  Follow-Up: Follow up with the Hypertension Clinic and then with Dr [name] as determined necessary.  Thank you for choosing [facility]!!

## 2019-02-02 NOTE — Progress Notes (Signed)
Cardiology Office Note:    Date:  02/02/2019   ID:  Eddie Simmons, DOB 1975-08-05, MRN [id number]  PCP:  Georgann Housekeeper, MD  Cardiologist:  Donato Schultz, MD  Electrophysiologist:  None   Referring MD: Georgann Housekeeper, MD     History of Present Illness:    Eddie Simmons is a 44 y.o. male here for the evaluation of hypertension and hyperlipidemia at the request of Dr. Eula Listen.  Has schizophrenia since 2005, chronic kidney disease stage II with creatinine of 1.3 hypertension since 2009 and hyperlipidemia as well.  No drug allergies.  He has been on simvastatin 40 mg as well as amlodipine 10 mg lisinopril 40.  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  When goes to dentist, BP is high.,  Blood pressure has been significantly elevated sometimes 100s diastolic  Father on HD at 99, had HTN.  Brother adrenal gland had HTN.   He denies any syncope bleeding fevers chills nausea vomiting.  Does not have any symptoms with his hypertension.  He does check his blood pressure occasionally at home.  Usually quite high there as well.    Past Medical History:  Diagnosis Date  . CKD (chronic kidney disease), stage II   . Hypercholesteremia   . Hypertension   . Obesity   . Pre-diabetes   . Schizophrenia [facility])     Past Surgical History:  Procedure Laterality Date  . ANKLE SURGERY     left  . TESTICLE REMOVAL     Right    Current Medications: Current Meds  Medication Sig  . amLODipine (NORVASC) 10 MG tablet Take 10 mg by mouth daily.  Marland Kitchen doxazosin (CARDURA) 4 MG tablet Take 4 mg by mouth at bedtime.  . hydrochlorothiazide (HYDRODIURIL) 12.5 MG tablet Take 12.5 mg by mouth every morning.  Marland Kitchen lisinopril (PRINIVIL,ZESTRIL) 40 MG tablet Take 40 mg by mouth daily.  . potassium chloride (KLOR-CON) 8 MEQ tablet Take 8 mEq by mouth daily.  . risperidone (RISPERDAL) 4 MG tablet   . [DISCONTINUED] simvastatin (ZOCOR) 40 MG tablet every evening.     Allergies:   Patient has no  known allergies.   Social History   Socioeconomic History  . Marital status: Single    Spouse name: Not on file  . Number of children: Not on file  . Years of education: Not on file  . Highest education level: Not on file  Occupational History  . Not on file  Tobacco Use  . Smoking status: Never Smoker  . Smokeless tobacco: Never Used  Substance and Sexual Activity  . Alcohol use: No  . Drug use: No  . Sexual activity: Not on file  Other Topics Concern  . Not on file  Social History Narrative  . Not on file   Social Determinants of Health   Financial Resource Strain:   . Difficulty of Paying Living Expenses: Not on file  Food Insecurity:   . Worried About Programme researcher, broadcasting/film/video in the Last Year: Not on file  . Ran Out of Food in the Last Year: Not on file  Transportation Needs:   . Lack of Transportation (Medical): Not on file  . Lack of Transportation (Non-Medical): Not on file  Physical Activity:   . Days of Exercise per Week: Not on file  . Minutes of Exercise per Session: Not on file  Stress:   . Feeling of Stress : Not on file  Social Connections:   .  Frequency of Communication with Friends and Family: Not on file  . Frequency of Social Gatherings with Friends and Family: Not on file  . Attends Religious Services: Not on file  . Active Member of Clubs or Organizations: Not on file  . Attends Banker Meetings: Not on file  . Marital Status: Not on file     Family History: The patient's family history includes Healthy in his son.  ROS:   Please see the history of present illness.     All other systems reviewed and are negative.  EKGs/Labs/Other Studies Reviewed:    The following studies were reviewed today: Outside labs reviewed and outside notes reviewed.  LDL of 108 hemoglobin A1c 6.2 creatinine of 1.13 in March 2020.  TSH was normal at 1.5.  ALT was 38.  EKG:  EKG is  ordered today.  The ekg ordered today demonstrates 02/02/2019-sinus rhythm  98 with no other significant abnormalities.  Recent Labs: No results found for requested labs within last 8760 hours.  Recent Lipid Panel No results found for: CHOL, TRIG, HDL, CHOLHDL, VLDL, LDLCALC, LDLDIRECT  Physical Exam:    VS:  BP 130/80   Pulse 98   Ht 5\' 8"  (1.727 m)   Wt 278 lb (126.1 kg)   SpO2 92%   BMI 42.27 kg/m     Wt Readings from Last 3 Encounters:  02/02/19 278 lb (126.1 kg)  10/22/17 270 lb (122.5 kg)  01/09/14 240 lb (108.9 kg)     GEN: Overweight well nourished, well developed in no acute distress HEENT: Normal NECK: No JVD; No carotid bruits LYMPHATICS: No lymphadenopathy CARDIAC: RRR, no murmurs, rubs, gallops RESPIRATORY:  Clear to auscultation without rales, wheezing or rhonchi  ABDOMEN: Soft, non-tender, non-distended MUSCULOSKELETAL:  No edema; No deformity  SKIN: Warm and dry NEUROLOGIC:  Alert and oriented x 3 PSYCHIATRIC:  Normal affect   ASSESSMENT:    1. Essential hypertension   2. Medication management   3. Morbid obesity (HCC)   4. Tobacco use    PLAN:    In order of problems listed above:  Difficult to control hypertension -We will go ahead and add spironolactone 25 mg once a day.  Check a basic metabolic profile today to get a good baseline since it has been since March.  We will check another basic metabolic profile in 1 week and have him follow-up in the hypertension clinic here pharmacy lead.  His blood pressure today in clinic was not extremely high however at home he is still getting significantly high numbers and his blood pressures prohibiting him from having dental treatment.  We need to continue to work on this and I think the addition of spironolactone will be helpful.  Neck step could be to add clonidine potentially as well.  Hyperlipidemia -We are going to change his simvastatin 40 over to Crestor 20 since he is taking amlodipine to remove drug drug interactions.  Morbid obesity -BMI 42-continue to encourage weight  loss  Tobacco use -Continue to encourage smoking cessation.  Schizophrenia -Stable.  Treated by psychiatry with medication as above.  Close follow-up with lab work and pharmacy hypertension team.  Medication Adjustments/Labs and Tests Ordered: Current medicines are reviewed at length with the patient today.  Concerns regarding medicines are outlined above.  Orders Placed This Encounter  Procedures  . Basic Metabolic Panel (BMET)  . Basic Metabolic Panel (BMET)  . EKG 12-Lead   Meds ordered this encounter  Medications  . spironolactone (ALDACTONE)  25 MG tablet    Sig: Take 1 tablet (25 mg total) by mouth daily.    Dispense:  90 tablet    Refill:  3  . rosuvastatin (CRESTOR) 20 MG tablet    Sig: Take 1 tablet (20 mg total) by mouth daily.    Dispense:  90 tablet    Refill:  3    Patient Instructions  Medication Instructions:  Please discontinue you Simvastatin and start Crestor 20 mg a day.  Start Spironolactone 25 mg a day.  Continue all other mediations as listed.  *If you need a refill on your cardiac medications before your next appointment, please call your pharmacy*  Lab Work: Please have blood work today and again in 1 week. (BMP)  If you have labs (blood work) drawn today and your tests are completely normal, you will receive your results only by: Marland Kitchen MyChart Message (if you have MyChart) OR . A paper copy in the mail If you have any lab test that is abnormal or we need to change your treatment, we will call you to review the results.  You have been referred to our Hypertension Clinic.  Follow-Up: Follow up with the Hypertension Clinic and then with Dr Marlou Porch as determined necessary.  Thank you for choosing [facility]!!         Signed, Candee Furbish, MD  02/02/2019 11:39 AM    [place]

## 2019-02-09 ENCOUNTER — Other Ambulatory Visit: Payer: Medicare Other | Admitting: *Deleted

## 2019-02-09 ENCOUNTER — Other Ambulatory Visit: Payer: Self-pay

## 2019-02-09 DIAGNOSIS — Z79899 Other long term (current) drug therapy: Secondary | ICD-10-CM

## 2019-02-09 DIAGNOSIS — I10 Essential (primary) hypertension: Secondary | ICD-10-CM

## 2019-02-10 LAB — BASIC METABOLIC PANEL
BUN/Creatinine Ratio: 5 — ABNORMAL LOW (ref 9–20)
BUN: 5 mg/dL — ABNORMAL LOW (ref 6–24)
CO2: 26 mmol/L (ref 20–29)
Calcium: 9.5 mg/dL (ref 8.7–10.2)
Chloride: 103 mmol/L (ref 96–106)
Creatinine, Ser: 0.99 mg/dL (ref 0.76–1.27)
GFR calc Af Amer: 109 mL/min/{1.73_m2} (ref >59)
GFR calc non Af Amer: 94 mL/min/{1.73_m2} (ref >59)
Glucose: 135 mg/dL — ABNORMAL HIGH (ref 65–99)
Potassium: 3.9 mmol/L (ref 3.5–5.2)
Sodium: 145 mmol/L — ABNORMAL HIGH (ref 134–144)

## 2019-02-14 ENCOUNTER — Encounter: Payer: Self-pay | Admitting: Pharmacist

## 2019-02-14 ENCOUNTER — Ambulatory Visit (INDEPENDENT_AMBULATORY_CARE_PROVIDER_SITE_OTHER): Payer: Medicare Other | Admitting: Pharmacist

## 2019-02-14 ENCOUNTER — Other Ambulatory Visit: Payer: Self-pay

## 2019-02-14 VITALS — BP 130/100 | HR 82

## 2019-02-14 DIAGNOSIS — I10 Essential (primary) hypertension: Secondary | ICD-10-CM

## 2019-02-14 NOTE — Patient Instructions (Addendum)
It was a pleasure to meet you today!  Please cut back your coffee intake to 2 cups (16oz)/day   Try to add more protein to your diet  Please call us at [phone] with any questions or concerns

## 2019-02-14 NOTE — Progress Notes (Signed)
Patient ID: Brailon Simmons                 DOB: [birth year]                      MRN: [id number]     HPI: Eddie Simmons is a 44 y.o. male referred by Dr. Marlou Porch to HTN clinic. PMH is significant for schizophrenia since 2005, chronic kidney disease stage II with creatinine of 1.3 hypertension since 2009 and hyperlipidemia as well.   Patient was seen by Dr. Marlou Porch on 02/02/2019. His blood pressure was relatively well controlled at that visit, however he reports his blood pressure at home is significantly elevated. It is also elevated at the dentist which is preventing his from having dental work. Spironolactone 25mg  daily was added at this visit. Repeat BMP on 02/09/19 showed stable scr and potassium. BUN slightly low.   Patient presents today to clinic for blood pressure check. He dizziness, lightheadedness, headache, blurred vision, SOB or swelling. Patient is very concerned about his cortisol levels and is requesting a workout for secondary causes of hypertension.  Patient states he is tolerating medications well. He checks his blood pressure at home, but did not bring his readings. States its around 148/95. He drinks about 6 cups of coffee per day and smokes about a pack a day of cigarettes. He states that awhile ago he ate vegan and walked 20 miles a week and lost a lot of weight. He started eating meat and eating sugar again and gained it back. He states that he started eating vegan again about 4 months ago. Also cut out sugar. However he does drink a glass of juice a day and made cookies yesterday.   Blood pressure in clinic today was 138/90 and 130/100 on repeat. Patient repeated several times that his cortisol must be high and that metoprolol controlled his blood pressure. Patient does not think quitting smoking is important. He states he will control this with his eating habits.  Current HTN meds: amlodipine 10mg  daily, doxazosin 4mg  at bedtime, hydrochlorothiazide 12.5mg  daily, lisinopril 40mg   daily, spironolactone 25mg  daily Previously tried: metoprolol  BP goal: <130/80  Family History: Father on HD at 42, had HTN.  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  Past Medical History:  Diagnosis Date  . CKD (chronic kidney disease), stage II   . Hypercholesteremia   . Hypertension   . Obesity   . Pre-diabetes   . Schizophrenia [facility])     Current Outpatient Medications on File Prior to Visit  Medication Sig Dispense Refill  . amLODipine (NORVASC) 10 MG tablet Take 10 mg by mouth daily.  3  . doxazosin (CARDURA) 4 MG tablet Take 4 mg by mouth at bedtime.    Marland Kitchen  hydrochlorothiazide (HYDRODIURIL) 12.5 MG tablet Take 12.5 mg by mouth every morning.    Marland Kitchen lisinopril (PRINIVIL,ZESTRIL) 40 MG tablet Take 40 mg by mouth daily.  3  . potassium chloride (KLOR-CON) 8 MEQ tablet Take 8 mEq by mouth daily.    . risperidone (RISPERDAL) 4 MG tablet     . rosuvastatin (CRESTOR) 20 MG tablet Take 1 tablet (20 mg total) by mouth daily. 90 tablet 3  . spironolactone (ALDACTONE) 25 MG tablet Take 1 tablet (25 mg total) by mouth daily. 90 tablet 3   No current facility-administered medications on file prior to visit.    No Known Allergies  There were no vitals taken for this visit.   Assessment/Plan:  1. Hypertension - Blood pressure  above goal of <130/80 in clinic. Recommended we increase spironolactone to 50mg  daily. However patient refused to make any medication changes until his cortisol is checked. I tried to explain that we usually check cortisol when the blood pressure is low, however patient insists that his brother had high cortisol and his father did who is on HD. I spoke to Dr. about doing a secondary HTN workup. He agreeded that a cortisol level may not tell Eddie Simmons much, but was ok to order along with a workup for VMA, hyperaldosteronism, metanephines and TSH. Once labs result, will discuss with patient increasing spironolactone. Educated patient on the importance of decreasing caffeine intake. Ive requested he cut back to 2 cups (16oz) per day. I also advised that his BUN was a little low. He should consider increasing his protein intake. Follow up in clinic in 2 weeks.  2. Tobacco cessation-Patient states that stopping smoking is not important. Tried to discuss the damage that tobacco does on our arteries and increases our blood pressure. Patient insistent that he can offset the damage/risk of cancer with his diet. I advised him that this was not accurate. Advised that when he is ready to quite we are here to help.   Thank you  [place], Pharm.D, BCPS, CPP [facility]  [address] [place], [place]  Phone: [phone]; Fax: [phone]

## 2019-02-15 ENCOUNTER — Other Ambulatory Visit: Payer: Medicare Other | Admitting: *Deleted

## 2019-02-15 DIAGNOSIS — I10 Essential (primary) hypertension: Secondary | ICD-10-CM

## 2019-02-15 NOTE — Addendum Note (Signed)
Addended by: Malena Peer D on: 02/15/2019 07:37 AM   Modules accepted: Orders

## 2019-02-15 NOTE — Addendum Note (Signed)
Addended by: Malena Peer D on: 02/15/2019 07:46 AM   Modules accepted: Orders

## 2019-02-16 LAB — CORTISOL: Cortisol: 10.8 ug/dL

## 2019-02-22 LAB — VMA, RANDOM URINE
Creatinine, Random U: 100.7 mg/dL
VMA, Random Urine: 3 mg/L
VMA/Crt, Random U: 3 mg/g Creat (ref 0.0–6.0)

## 2019-02-26 LAB — TSH: TSH: 1.27 u[IU]/mL (ref 0.450–4.500)

## 2019-02-26 LAB — METANEPHRINES, URINE, 24 HOUR
Metaneph Total, Ur: 39 ug/L
Metanephrines, 24H Ur: 117 ug/24 hr (ref 58–276)
Normetanephrine, 24H Ur: 1308 ug/24 hr — ABNORMAL HIGH (ref 156–729)
Normetanephrine, Ur: 436 ug/L

## 2019-02-26 LAB — METANEPHRINES, PLASMA
Metanephrine, Free: 22.9 pg/mL (ref 0.0–88.0)
Normetanephrine, Free: 256.6 pg/mL — ABNORMAL HIGH (ref 0.0–125.8)

## 2019-02-26 LAB — ALDOSTERONE + RENIN ACTIVITY W/ RATIO: ALDOSTERONE: 13.6 ng/dL (ref 0.0–30.0)

## 2019-02-26 NOTE — Addendum Note (Signed)
Addended by: Malena Peer D on: 02/26/2019 10:47 AM   Modules accepted: Orders

## 2019-03-01 ENCOUNTER — Ambulatory Visit (INDEPENDENT_AMBULATORY_CARE_PROVIDER_SITE_OTHER): Payer: Medicare Other | Admitting: Pharmacist

## 2019-03-01 ENCOUNTER — Other Ambulatory Visit: Payer: Self-pay

## 2019-03-01 ENCOUNTER — Encounter: Payer: Self-pay | Admitting: Pharmacist

## 2019-03-01 ENCOUNTER — Other Ambulatory Visit: Payer: Self-pay | Admitting: *Deleted

## 2019-03-01 VITALS — BP 124/88 | HR 87

## 2019-03-01 DIAGNOSIS — I10 Essential (primary) hypertension: Secondary | ICD-10-CM | POA: Diagnosis not present

## 2019-03-01 DIAGNOSIS — R899 Unspecified abnormal finding in specimens from other organs, systems and tissues: Secondary | ICD-10-CM

## 2019-03-01 MED ORDER — SPIRONOLACTONE 50 MG PO TABS: 50.0000 mg | ORAL_TABLET | Freq: Every day | ORAL | 3 refills | Status: DC

## 2019-03-01 NOTE — Patient Instructions (Addendum)
It was a pleasure to see you in clinic today!  Please start taking spironolactone 50mg  daily. You may take 2 of the 25mg  tablets to equal 50mg  until you run out.  STOP taking the Potassium 8 MEQ tablets.  Before checking your blood pressure make sure: You are seated and quite for 5 min before checking Feet are flat on the floor Siting in chair with your back supported straight up and down Arm resting on table or arm of chair at heart level Bladder is empty You have NOT had caffeine or tobacco within the last 30 min  Check your blood pressure 2-3 times about 1-2 min apart. Usually the first reading will be the highest. Record these readings.  Only check your blood pressure once a day, unless otherwise directed Record your blood pressure readings and bring them to all your appointments. If your meter stores your readings in its memory, then you may bring your blood pressure meter with you to your appointments.  You can find a list of validated (accurate) blood pressure cuffs at  Lifestyle changes can make a world of difference and are even more important than medications: Try to keep your sodium intake to 2300 mg of sodium per day Get 6-8 uninterrupted hours of sleep per night Aim for a goal of 150 min of moderate aerobic exercise (ie brisk walking, bike riding) per week

## 2019-03-01 NOTE — Progress Notes (Signed)
Patient ID: Eddie Simmons                 DOB: 09/09/1975                      MRN: [id number]     HPI: Eddie Simmons is a 44 y.o. male referred by Dr. Marlou Porch to HTN clinic. PMH is significant for schizophrenia since 2005, chronic kidney disease stage II with creatinine of 1.3 hypertension since 2009 and hyperlipidemia as well.   Patient was seen by Dr. Marlou Porch on 02/02/2019. His blood pressure was relatively well controlled at that visit, however he reports his blood pressure at home is significantly elevated. It is also elevated at the dentist which is preventing his from having dental work. Spironolactone 25mg  daily was added at this visit. Repeat BMP on 02/09/19 showed stable scr and potassium. BUN slightly low.   At last visit, patient was very concerned about his cortisol levels and requested a workout for secondary causes of hypertension which was initiated. He refused to make any medication changes until cortisol was checked. He states that he did not think quitting smoking was important. He stated he will control this with his eating habits.  Cortisol, VMA, aldosterone levels came back normal. Normethanephrine (urine) was elevated. Dr. Marlou Porch will be referring to endocrinology for further workup for possible pheochromocytoma. Renin lab was canceled and needs to be recollected.  Patient was asked at last visit to decrease his coffee intake to 2 cups per day. He stated he lost a lot a weight a few years ago while eating vegan. He started eating Vegan again 4 months ago, but has not seen much weight loss.   Patient presents today to clinic for follow up. He denies dizziness, lightheadedness, headache, blurred vision, SOB or swelling. Denies an OTC medication use  Reviewed patients lab results with him and advised that he will be referred to endocrinology. Patient requested a referral to a particular nephrologist. Advised that this referral would need to go through his primary, but that Dr. Marlou Porch  would like him to see endocrinology.  Patient reports a blood pressure of 197/145 at home. However blood pressure in clinic was 150/90 and then 124/88 on repeat check.   Current HTN meds: amlodipine 10mg  daily, doxazosin 4mg  at bedtime, hydrochlorothiazide 12.5mg  daily, lisinopril 40mg  daily, spironolactone 25mg  daily Previously tried: metoprolol  BP goal: <130/80  Family History: Father on HD at 61, had HTN.  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  Past Medical History:  Diagnosis Date  . CKD (chronic kidney disease), stage II   . Hypercholesteremia   . Hypertension   . Obesity   . Pre-diabetes   . Schizophrenia [facility])     Current Outpatient Medications on File Prior to Visit  Medication Sig Dispense Refill  . amLODipine (NORVASC) 10 MG tablet Take 10 mg by mouth daily.  3  . doxazosin (CARDURA) 4 MG tablet Take 4 mg by mouth at bedtime.    . hydrochlorothiazide (HYDRODIURIL) 12.5 MG tablet Take 12.5 mg by mouth every morning.    Marland Kitchen lisinopril (PRINIVIL,ZESTRIL) 40 MG tablet Take 40 mg by mouth daily.  3  . potassium chloride (KLOR-CON) 8 MEQ tablet Take 8 mEq by mouth daily.    . risperidone (RISPERDAL) 4 MG tablet     . rosuvastatin (CRESTOR) 20 MG tablet Take 1 tablet (20 mg total) by mouth daily. 90 tablet 3  . spironolactone (ALDACTONE) 25 MG tablet Take 1  tablet (25 mg total) by mouth daily. 90 tablet 3   No current facility-administered medications on file prior to visit.    No Known Allergies  There were no vitals taken for this visit.   Assessment/Plan:  1. Hypertension - Blood pressure above goal of <130/80 in clinic. Patient is agreeable to increasing spironolactone to 50mg  daily. Will STOP KCL tablets. Check BMP in 2 weeks. Patient wished to push office visit out to 1 month. Educated patient about avoiding salt intake. Recommended no more than 2300mg  of sodium per day. Also advised patient to increase his exercise to 30 min, 5 days a week. It sounds like patient walks to all his appointments and errands. Will send patient to lab for recollection of rein/aldosterone labs and metanephrine plasma lab. Patient was asked to bring his blood pressure machine with him to his next visit since there is a big discrepancy between what he says his blood pressure is at home and what it is in clinic.  2. Tobacco cessation-not address at this visit   Thank you  , Pharm.D, BCPS, CPP [facility]  [address] [place], [address] [place]  Phone: [phone]; Fax: [phone]

## 2019-03-10 LAB — METANEPHRINES, PLASMA
Metanephrine, Free: 18.8 pg/mL (ref 0.0–88.0)
Normetanephrine, Free: 190.2 pg/mL — ABNORMAL HIGH (ref 0.0–125.8)

## 2019-03-10 LAB — ALDOSTERONE + RENIN ACTIVITY W/ RATIO
ALDOS/RENIN RATIO: 9.2 (ref 0.0–30.0)
ALDOSTERONE: 22.9 ng/dL (ref 0.0–30.0)
Renin: 2.476 ng/mL/h (ref 0.167–5.380)

## 2019-03-12 ENCOUNTER — Telehealth: Payer: Self-pay

## 2019-03-12 DIAGNOSIS — Z79899 Other long term (current) drug therapy: Secondary | ICD-10-CM

## 2019-03-12 DIAGNOSIS — R899 Unspecified abnormal finding in specimens from other organs, systems and tissues: Secondary | ICD-10-CM

## 2019-03-12 DIAGNOSIS — I10 Essential (primary) hypertension: Secondary | ICD-10-CM

## 2019-03-12 NOTE — Telephone Encounter (Signed)
-----   Message from Jake Bathe, MD sent at 03/12/2019  7:14 AM EST ----- Please follow up with referral to endocrine. Thanks. Donato Schultz, MD

## 2019-03-12 NOTE — Telephone Encounter (Signed)
Unable to LM for pt due to no voicemail

## 2019-03-15 ENCOUNTER — Other Ambulatory Visit: Payer: Medicare Other

## 2019-03-16 ENCOUNTER — Encounter: Payer: Self-pay | Admitting: *Deleted

## 2019-03-19 ENCOUNTER — Other Ambulatory Visit: Payer: Medicare Other | Admitting: *Deleted

## 2019-03-19 ENCOUNTER — Other Ambulatory Visit: Payer: Self-pay

## 2019-03-19 ENCOUNTER — Telehealth: Payer: Self-pay | Admitting: Pharmacist

## 2019-03-19 DIAGNOSIS — I10 Essential (primary) hypertension: Secondary | ICD-10-CM

## 2019-03-19 NOTE — Telephone Encounter (Signed)
Lab order

## 2019-03-20 LAB — BASIC METABOLIC PANEL
BUN/Creatinine Ratio: 5 — ABNORMAL LOW (ref 9–20)
BUN: 5 mg/dL — ABNORMAL LOW (ref 6–24)
CO2: 26 mmol/L (ref 20–29)
Calcium: 9.3 mg/dL (ref 8.7–10.2)
Chloride: 102 mmol/L (ref 96–106)
Creatinine, Ser: 1.11 mg/dL (ref 0.76–1.27)
GFR calc Af Amer: 95 mL/min/{1.73_m2} (ref >59)
GFR calc non Af Amer: 82 mL/min/{1.73_m2} (ref >59)
Glucose: 96 mg/dL (ref 65–99)
Potassium: 4.2 mmol/L (ref 3.5–5.2)
Sodium: 141 mmol/L (ref 134–144)

## 2019-03-29 ENCOUNTER — Encounter: Payer: Self-pay | Admitting: Pharmacist

## 2019-03-29 ENCOUNTER — Ambulatory Visit (INDEPENDENT_AMBULATORY_CARE_PROVIDER_SITE_OTHER): Payer: Medicare Other | Admitting: Pharmacist

## 2019-03-29 ENCOUNTER — Other Ambulatory Visit: Payer: Self-pay

## 2019-03-29 VITALS — BP 132/86 | HR 89

## 2019-03-29 DIAGNOSIS — I10 Essential (primary) hypertension: Secondary | ICD-10-CM | POA: Diagnosis not present

## 2019-03-29 NOTE — Progress Notes (Signed)
Patient ID: Eddie Simmons                 DOB: 04-Aug-1975                      MRN: [id number]     HPI: Eddie Simmons is a 44 y.o. male referred by Dr. [name] to HTN clinic. PMH is significant for schizophrenia since 2005, chronic kidney disease stage II with creatinine of 1.3 hypertension since 2009 and hyperlipidemia as well.   At last visit to clinic patient's blood pressure had large discrepancies from his home readings. BP in office was 150/90 then 124/88 on recheck while patient reported his blood pressure was 197/145 at home. Patient was encouraged to bring his blood pressure cuff to his next visit. Patient also underwent a secondary workup for possible pheochromocytoma. Cortisol, VMA, aldosterone, metanephrine plasma were normal. Normethanephrine (urine) was elevated. Patient was referred to endocrinology. At this visit we increased spironolactone to 50mg  daily, stopped KCL tablets and checked BMP 2 weeks later (K 4.2, Scr 1.11).  Patient presents today to clinic for follow up. Patient reports taking his blood pressure only 1-2 times over the past month with an average reading of 147/70mmHg. Eddie Simmons reports drinking 2-3 cups of coffee to replace the soda he was drinking. He is currently vegetarian but still salting his foods. He is still smoking 1/2-1 pack per day and is not interested in quitting at this time. He is excited to be pursuing work as a Eddie Simmons and is motivated to lower his blood pressure to reach this goal. Patient denies any issues with his medications, dizziness or headache, and reports 100% adherence.  Current HTN meds: amlodipine 10mg  daily, doxazosin 4mg  at bedtime, hydrochlorothiazide 12.5mg  daily, lisinopril 40mg  daily, spironolactone 50mg  daily Previously tried: metoprolol  BP goal: <130/80  Family History: Father on HD at 61, had HTN.  Brother adrenal gland had HTN  Social History: current smoker pack a day working on becoming a  Diet: Vegetarian (oranges/bananas/tomato sandwiches/zuccini onions brown rice)  Black coffee 2-3 cups, salts his veggies  Exercise: walks 10-30 min about 3 days a week to walk to bus stop  Home BP readings:   Wt Readings from Last 3 Encounters:  02/02/19 278 lb (126.1 kg)  10/22/17 270 lb (122.5 kg)  01/09/14 240 lb (108.9 kg)   BP Readings from Last 3 Encounters:  03/01/19 124/88  02/14/19 (!) 130/100  02/02/19 130/80   Pulse Readings from Last 3 Encounters:  03/01/19 87  02/14/19 82  02/02/19 98    Renal function: CrCl cannot be calculated (Unknown ideal weight.).  Past Medical History:  Diagnosis Date  . CKD (chronic kidney disease), stage II   . Hypercholesteremia   . Hypertension   . Obesity   . Pre-diabetes   . Schizophrenia [facility])     Current Outpatient Medications on File Prior to Visit  Medication Sig Dispense Refill  . amLODipine (NORVASC) 10 MG tablet Take 10 mg by mouth daily.  3  . doxazosin (CARDURA) 4 MG tablet Take 4 mg by mouth at bedtime.    . hydrochlorothiazide (HYDRODIURIL) 12.5 MG tablet Take 12.5 mg by mouth every morning.    04/02/19 lisinopril (PRINIVIL,ZESTRIL) 40 MG tablet Take 40 mg by mouth daily.  3  . risperidone (RISPERDAL) 4 MG tablet     . rosuvastatin (CRESTOR) 20 MG tablet Take 1 tablet (20 mg total) by mouth daily. 90 tablet  3  . spironolactone (ALDACTONE) 50 MG tablet Take 1 tablet (50 mg total) by mouth daily. 90 tablet 3   No current facility-administered medications on file prior to visit.    No Known Allergies  There were no vitals taken for this visit.   Assessment/Plan:  1. Hypertension - Based on blood pressure goal of <130/80, patient is meeting his goal on his current regimen. Patient's BP read 122/74 manually and 132/86 on the automatic cuff. Patient is scheduled to see his endocrinologist in a week to follow up on his elevated normethanephrine (urine). Patient was encouraged to take his blood pressure at home  at least a few times per week and bring in his readings to his appointments and call our clinic if his blood pressure increases. Patient to continue current regimen (amlodipine 10mg  daily, doxazosin 4mg  at bedtime, hydrochlorothiazide 12.5mg  daily, lisinopril 40mg  daily, spironolactone 50mg  daily).  2. Smoking Cessation - Patient is not interested in quitting at this time.   Eddie Simmons, PharmD Candidate  Eddie Simmons, Pharm.D, BCPS, CPP [place]  [address], [place], [place] [address]  Phone: [phone]; Fax: [phone]

## 2019-03-29 NOTE — Patient Instructions (Addendum)
It was great seeing you today!  Today we discussed your blood pressure. Your goal is <130/7mmHg and you are meeting this goal.  Please follow up with your endocrinologist and continue taking all of your medications as prescribed.   Please continue to check your blood pressure at home a few days a week.  Please call us at [phone] with any questions or concerns or if your blood pressure starts to run >130/80

## 2019-03-30 ENCOUNTER — Other Ambulatory Visit: Payer: Self-pay

## 2019-04-03 ENCOUNTER — Ambulatory Visit (INDEPENDENT_AMBULATORY_CARE_PROVIDER_SITE_OTHER): Payer: Medicare Other | Admitting: Internal Medicine

## 2019-04-03 ENCOUNTER — Encounter: Payer: Self-pay | Admitting: Internal Medicine

## 2019-04-03 ENCOUNTER — Other Ambulatory Visit: Payer: Self-pay

## 2019-04-03 VITALS — BP 118/82 | HR 87 | Temp 98.1°F | Ht 68.0 in | Wt 278.6 lb

## 2019-04-03 DIAGNOSIS — I10 Essential (primary) hypertension: Secondary | ICD-10-CM | POA: Diagnosis not present

## 2019-04-03 NOTE — Patient Instructions (Signed)

## 2019-04-03 NOTE — Progress Notes (Signed)
Name: Eddie Simmons  MRN/ DOB: [id number], 11-Sep-1975    Age/ Sex: 44 y.o., male    PCP: Wenda Low, MD   Reason for Endocrinology Evaluation: Elevated normetanephrines     Date of Initial Endocrinology Evaluation: 04/03/2019     HPI: [name] is a 44 y.o. male with a past medical history of HTN, schizeophrenia and Dyslipidemia. The patient presented for initial endocrinology clinic visit on 04/03/2019 for consultative assistance with his Elevated normetanephrines  Pt was sent her by Cardiology due to elevated urine normetanephrines, this was found during evaluation for difficult to control hypertension.   He was diagnosed with HTN at age 62 .  His evaluation included a serum cortisol of 10.8 ug/dL, renin 2.476 ng/mL , aldosterone 22.9 ng/dL with a ratio of 9.2, no concomitant serum potassium at the time.  He also had plasma metanephrine level normal at 22.9 pg/mL, with elevated plasma metanephrine at 256.6 pg/mL (which is about 2 x the upper limit of normal).  Patient also had 24-hour urine collection for metanephrines which were normal at 117 ug and elevated normetanephrine's at 1308  ug (which is about 2x the upper limit of normal)  Patient denies episodes of palpitations, clammy or sweaty sensation, dizziness, or episodic hypertension.  Prior to being on spironolactone his BP was consistently elevated, but since starting the spironolactone his blood pressure has been well controlled at home and during his office visit today.  Denies any panic attacks   He denies alcohol intake, he also denies OTC or decongestant intake  He has been on risperidone for the past 2 years   Brother with adrenal gland issue Father with HTN  HISTORY:  Past Medical History:  Past Medical History:  Diagnosis Date  . CKD (chronic kidney disease), stage II   . Hypercholesteremia   . Hypertension   . Obesity   . Pre-diabetes   . Schizophrenia [facility])    Past Surgical History:  Past  Surgical History:  Procedure Laterality Date  . ANKLE SURGERY     left  . TESTICLE REMOVAL     Right      Social History:  reports that he has been smoking. He has never used smokeless tobacco. He reports that he does not drink alcohol or use drugs.  Family History: family history includes Healthy in his son.   HOME MEDICATIONS: Allergies as of 04/03/2019   No Known Allergies     Medication List       Accurate as of April 03, 2019 11:04 AM. If you have any questions, ask your nurse or doctor.        amLODipine 10 MG tablet Commonly known as: NORVASC Take 10 mg by mouth daily.   doxazosin 4 MG tablet Commonly known as: CARDURA Take 4 mg by mouth at bedtime.   hydrochlorothiazide 12.5 MG tablet Commonly known as: HYDRODIURIL Take 12.5 mg by mouth every morning.   lisinopril 40 MG tablet Commonly known as: ZESTRIL Take 40 mg by mouth daily.   risperidone 4 MG tablet Commonly known as: RISPERDAL   rosuvastatin 20 MG tablet Commonly known as: CRESTOR Take 1 tablet (20 mg total) by mouth daily.   spironolactone 50 MG tablet Commonly known as: Aldactone Take 1 tablet (50 mg total) by mouth daily.         REVIEW OF SYSTEMS: A comprehensive ROS was conducted with the patient and is negative except as per HPI     OBJECTIVE:  VS: BP  118/82 (BP Location: Left Arm, Patient Position: Sitting, Cuff Size: Large)   Pulse 87   Temp 98.1 F (36.7 C)   Ht 5\' 8"  (1.727 m)   Wt 278 lb 9.6 oz (126.4 kg)   SpO2 98%   BMI 42.36 kg/m    Wt Readings from Last 3 Encounters:  04/03/19 278 lb 9.6 oz (126.4 kg)  02/02/19 278 lb (126.1 kg)  10/22/17 270 lb (122.5 kg)     EXAM: General: Pt appears well and is in NAD  Neck: General: Supple without adenopathy. Thyroid: Thyroid size normal.  No goiter or nodules appreciated. No thyroid bruit.  Lungs: Clear with good BS bilat with no rales, rhonchi, or wheezes  Heart: Auscultation: RRR.  Abdomen: Normoactive bowel  sounds, soft, nontender, without masses or organomegaly palpable  Extremities:  BL LE: No pretibial edema normal ROM and strength.  Skin: Hair: Texture and amount normal with gender appropriate distribution Skin Inspection: No rashes Skin Palpation: Skin temperature, texture, and thickness normal to palpation  Neuro: Cranial nerves: II - XII grossly intact  Motor: Normal strength throughout DTRs: 2+ and symmetric in UE without delay in relaxation phase  Mental Status: Judgment, insight: Intact Orientation: Oriented to time, place, and person Mood and affect: No depression, anxiety, or agitation     DATA REVIEWED: Results for IZZAK, Simmons (MRN Eddie Simmons) as of 04/03/2019 10:57  Ref. Range 03/01/2019 11:21  ALDOSTERONE Latest Ref Range: 0.0 - 30.0 ng/dL 04/29/2019  Renin Latest Ref Range: 0.167 - 5.380 ng/mL/hr 2.476  ALDOS/RENIN RATIO Latest Ref Range: 0.0 - 30.0  9.2  Metanephrine, Pl Latest Ref Range: 0.0 - 88.0 pg/mL 18.8  Normetanephrine, Pl Latest Ref Range: 0.0 - 125.8 pg/mL 190.2 (H)     Results for Eddie, Simmons (MRN Eddie Simmons) as of 04/03/2019 10:57  Ref. Range 02/18/2019 10:50  Metanephrine, Ur Latest Ref Range: Undefined ug/L 39  Normetanephrine, Ur Latest Ref Range: Undefined ug/L 436  Metanephrines, 24H Ur Latest Ref Range: 58 - 276 ug/24 hr 117  Normetanephr.,U,24h Latest Ref Range: 156 - 729 ug/24 hr 1,308 (H)   ASSESSMENT/PLAN/RECOMMENDATIONS:   1. Elevated Normetanephrines  -Patient has no clinical evidence of pheochromocytoma -We did discuss causes of false elevation in metanephrines/normetanephrines to include alcohol use, decongestant use, antipsychotic,withdrawal with clonidine etc -The catecholamine excretion in pheochromocytoma is usually 3-5x the upper limit of normal, which the patient does not meet. -I suspect his elevation in normetanephrine may have to do with the fact that he is on risperidone -Brother with adrenal tumor per the patient, and known type, I  have encouraged him to contact his brother and provide more information -We will repeat 24-hour urine collection to check metanephrines and catecholamines   Follow-up pending test results   Signed electronically by: 02/20/2019, MD  [facility]  [facility] [address] [place] [place], [place] Phone: [phone] FAX: [phone]   CC: [phone], MD [address] [place] Phone: [phone] Fax: [phone]   Return to Endocrinology clinic as below: No future appointments.

## 2020-01-25 ENCOUNTER — Other Ambulatory Visit: Payer: Self-pay | Admitting: Cardiology

## 2020-03-23 ENCOUNTER — Other Ambulatory Visit: Payer: Self-pay | Admitting: Cardiology

## 2020-04-21 DIAGNOSIS — I10 Essential (primary) hypertension: Secondary | ICD-10-CM | POA: Diagnosis not present

## 2020-04-21 DIAGNOSIS — R7303 Prediabetes: Secondary | ICD-10-CM | POA: Diagnosis not present

## 2020-04-21 DIAGNOSIS — Z1389 Encounter for screening for other disorder: Secondary | ICD-10-CM | POA: Diagnosis not present

## 2020-04-21 DIAGNOSIS — E7800 Pure hypercholesterolemia, unspecified: Secondary | ICD-10-CM | POA: Diagnosis not present

## 2020-04-21 DIAGNOSIS — Z0000 Encounter for general adult medical examination without abnormal findings: Secondary | ICD-10-CM | POA: Diagnosis not present

## 2020-08-21 DIAGNOSIS — K143 Hypertrophy of tongue papillae: Secondary | ICD-10-CM | POA: Diagnosis not present

## 2020-10-02 DIAGNOSIS — E7800 Pure hypercholesterolemia, unspecified: Secondary | ICD-10-CM | POA: Diagnosis not present

## 2020-10-02 DIAGNOSIS — F17210 Nicotine dependence, cigarettes, uncomplicated: Secondary | ICD-10-CM | POA: Diagnosis not present

## 2020-10-02 DIAGNOSIS — I10 Essential (primary) hypertension: Secondary | ICD-10-CM | POA: Diagnosis not present

## 2020-10-02 DIAGNOSIS — R7303 Prediabetes: Secondary | ICD-10-CM | POA: Diagnosis not present

## 2021-01-24 ENCOUNTER — Other Ambulatory Visit: Payer: Self-pay | Admitting: Cardiology

## 2021-04-23 DIAGNOSIS — N182 Chronic kidney disease, stage 2 (mild): Secondary | ICD-10-CM | POA: Diagnosis not present

## 2021-04-23 DIAGNOSIS — R7303 Prediabetes: Secondary | ICD-10-CM | POA: Diagnosis not present

## 2021-04-23 DIAGNOSIS — E7800 Pure hypercholesterolemia, unspecified: Secondary | ICD-10-CM | POA: Diagnosis not present

## 2021-04-23 DIAGNOSIS — I10 Essential (primary) hypertension: Secondary | ICD-10-CM | POA: Diagnosis not present

## 2021-04-23 DIAGNOSIS — F17210 Nicotine dependence, cigarettes, uncomplicated: Secondary | ICD-10-CM | POA: Diagnosis not present

## 2021-04-23 DIAGNOSIS — Z1389 Encounter for screening for other disorder: Secondary | ICD-10-CM | POA: Diagnosis not present

## 2021-04-23 DIAGNOSIS — Z0000 Encounter for general adult medical examination without abnormal findings: Secondary | ICD-10-CM | POA: Diagnosis not present

## 2021-08-06 DIAGNOSIS — M79642 Pain in left hand: Secondary | ICD-10-CM | POA: Diagnosis not present

## 2021-08-10 ENCOUNTER — Ambulatory Visit (INDEPENDENT_AMBULATORY_CARE_PROVIDER_SITE_OTHER): Payer: Medicare Other | Admitting: Podiatry

## 2021-08-10 DIAGNOSIS — B351 Tinea unguium: Secondary | ICD-10-CM

## 2021-08-10 NOTE — Progress Notes (Signed)
   Chief Complaint  Patient presents with   Nail Problem    Bilateral toe nail fungus     Subjective: 46 y.o. male presenting today as a reestablish new patient for follow-up evaluation of chronic onychomycosis of the bilateral toenails.  Patient was seen here in the office 2019 at which time Lamisil 250 mg #90 was sent to the pharmacy.  He noticed significant improvement at that time.  He was also supposed to have laser antifungal treatment however our machine was in service at that time.  He presents for further treatment evaluation  Past Medical History:  Diagnosis Date   CKD (chronic kidney disease), stage II    Hypercholesteremia    Hypertension    Obesity    Pre-diabetes    Schizophrenia (HCC)     Past Surgical History:  Procedure Laterality Date   ANKLE SURGERY     left   TESTICLE REMOVAL     Right    No Known Allergies  Objective: Physical Exam General: The patient is alert and oriented x3 in no acute distress.  Dermatology: Hyperkeratotic, discolored, thickened, onychodystrophy noted mainly to the right second digit and left fifth toe. Skin is warm, dry and supple bilateral lower extremities. Negative for open lesions or macerations.  Vascular: Palpable pedal pulses bilaterally. No edema or erythema noted. Capillary refill within normal limits.  Neurological: Epicritic and protective threshold grossly intact bilaterally.   Musculoskeletal Exam: No pedal deformity noted  Assessment: #1 Onychomycosis of toenails  Plan of Care:  #1 Patient was evaluated. #2  Today again we discussed different treatment options including oral, topical, and laser antifungal treatment modalities.  We discussed their efficacies and side effects.  Patient opts for oral antifungal treatment modality #3 prescription for Lamisil 250 mg #90 daily. Pt denies a history of liver pathology or symptoms.  Patient is otherwise healthy.  Patient does not drink alcohol #4  Appointment with nurse  for laser antifungal treatment  #5 return to clinic 6 months   Felecia Shelling, DPM [facility]  Dr. Felecia Shelling, DPM    [address] [place], [place] [address]                Office [phone]  Fax [phone]

## 2021-08-12 ENCOUNTER — Telehealth: Payer: Self-pay | Admitting: Podiatry

## 2021-08-12 NOTE — Telephone Encounter (Signed)
Patient called and stated they was suppose to receive medication at their pharmacy for toenail fungus yesterday. Pharmacy has been updated in chart   Please advise

## 2021-08-12 NOTE — Telephone Encounter (Signed)
Pt called again as he has not heard back about the medication being sent in to the pharmacy and he has been waiting a couple of days. [facility] would like [facility] [address]

## 2021-08-13 ENCOUNTER — Telehealth: Payer: Self-pay | Admitting: Podiatry

## 2021-08-13 NOTE — Telephone Encounter (Signed)
Patient called 3rd time regarding antifungal medication was to be called in for a 90 day supply to  his pharmacy and it still has not been called in  .  Please advise

## 2021-08-14 ENCOUNTER — Other Ambulatory Visit: Payer: Self-pay | Admitting: Podiatry

## 2021-08-14 DIAGNOSIS — B351 Tinea unguium: Secondary | ICD-10-CM | POA: Diagnosis not present

## 2021-08-14 MED ORDER — TERBINAFINE HCL 250 MG PO TABS: 250.0000 mg | ORAL_TABLET | Freq: Every day | ORAL | 0 refills | Status: DC

## 2021-08-14 NOTE — Telephone Encounter (Signed)
Pres[facility] to [facility] [place] on [address].  Thanks, Dr. Logan Bores

## 2021-08-14 NOTE — Telephone Encounter (Signed)
[facility] [place] on [address].  Please notify patient.  Thanks, Dr. Logan Bores

## 2021-08-17 NOTE — Telephone Encounter (Signed)
Called the patient to let him know that the rx was sent to the pharmacy, the recorded message states that the person can not be reached.

## 2021-09-25 DIAGNOSIS — B351 Tinea unguium: Secondary | ICD-10-CM | POA: Diagnosis not present

## 2021-10-02 ENCOUNTER — Ambulatory Visit (INDEPENDENT_AMBULATORY_CARE_PROVIDER_SITE_OTHER): Payer: Medicare Other

## 2021-10-02 DIAGNOSIS — B351 Tinea unguium: Secondary | ICD-10-CM

## 2021-10-02 NOTE — Progress Notes (Signed)
Patient presents today for the 1st laser treatment. Diagnosed with mycotic nail infection by Dr. Logan Bores.   Toenail most affected right 2nd and 5th left.  All other systems are negative.  Nails were filed thin. Laser therapy was administered to 1-5 toenails bilateral and patient tolerated the treatment well. All safety precautions were in place.   Patient report taking Lamisil 250mg  daily   Follow up in 4 weeks for laser # 2.

## 2021-10-02 NOTE — Patient Instructions (Signed)

## 2021-11-05 DIAGNOSIS — F17200 Nicotine dependence, unspecified, uncomplicated: Secondary | ICD-10-CM | POA: Diagnosis not present

## 2021-11-13 ENCOUNTER — Other Ambulatory Visit: Payer: Medicare Other

## 2021-12-04 DIAGNOSIS — B351 Tinea unguium: Secondary | ICD-10-CM | POA: Diagnosis not present

## 2022-02-24 DIAGNOSIS — F17210 Nicotine dependence, cigarettes, uncomplicated: Secondary | ICD-10-CM | POA: Diagnosis not present

## 2022-06-10 DIAGNOSIS — E7800 Pure hypercholesterolemia, unspecified: Secondary | ICD-10-CM | POA: Diagnosis not present

## 2022-06-10 DIAGNOSIS — Z0000 Encounter for general adult medical examination without abnormal findings: Secondary | ICD-10-CM | POA: Diagnosis not present

## 2022-06-10 DIAGNOSIS — N182 Chronic kidney disease, stage 2 (mild): Secondary | ICD-10-CM | POA: Diagnosis not present

## 2022-06-10 DIAGNOSIS — I10 Essential (primary) hypertension: Secondary | ICD-10-CM | POA: Diagnosis not present

## 2022-06-10 DIAGNOSIS — R7303 Prediabetes: Secondary | ICD-10-CM | POA: Diagnosis not present

## 2022-06-15 DIAGNOSIS — F17210 Nicotine dependence, cigarettes, uncomplicated: Secondary | ICD-10-CM | POA: Diagnosis not present

## 2022-09-30 DIAGNOSIS — K1379 Other lesions of oral mucosa: Secondary | ICD-10-CM | POA: Diagnosis not present

## 2023-03-31 DIAGNOSIS — E7800 Pure hypercholesterolemia, unspecified: Secondary | ICD-10-CM | POA: Diagnosis not present

## 2023-03-31 DIAGNOSIS — R7303 Prediabetes: Secondary | ICD-10-CM | POA: Diagnosis not present

## 2023-03-31 DIAGNOSIS — N182 Chronic kidney disease, stage 2 (mild): Secondary | ICD-10-CM | POA: Diagnosis not present

## 2023-03-31 DIAGNOSIS — Z1211 Encounter for screening for malignant neoplasm of colon: Secondary | ICD-10-CM | POA: Diagnosis not present

## 2023-03-31 DIAGNOSIS — F17210 Nicotine dependence, cigarettes, uncomplicated: Secondary | ICD-10-CM | POA: Diagnosis not present

## 2023-03-31 DIAGNOSIS — Z0000 Encounter for general adult medical examination without abnormal findings: Secondary | ICD-10-CM | POA: Diagnosis not present

## 2023-03-31 DIAGNOSIS — I10 Essential (primary) hypertension: Secondary | ICD-10-CM | POA: Diagnosis not present

## 2023-03-31 DIAGNOSIS — Z1389 Encounter for screening for other disorder: Secondary | ICD-10-CM | POA: Diagnosis not present

## 2023-06-03 ENCOUNTER — Other Ambulatory Visit

## 2023-07-20 ENCOUNTER — Encounter: Payer: Self-pay | Admitting: Gastroenterology

## 2023-07-20 ENCOUNTER — Other Ambulatory Visit (INDEPENDENT_AMBULATORY_CARE_PROVIDER_SITE_OTHER)

## 2023-07-20 ENCOUNTER — Ambulatory Visit: Payer: Self-pay | Admitting: Gastroenterology

## 2023-07-20 ENCOUNTER — Ambulatory Visit: Admitting: Gastroenterology

## 2023-07-20 VITALS — BP 136/78 | HR 82 | Ht 68.0 in | Wt 253.0 lb

## 2023-07-20 DIAGNOSIS — K625 Hemorrhage of anus and rectum: Secondary | ICD-10-CM

## 2023-07-20 DIAGNOSIS — Z1211 Encounter for screening for malignant neoplasm of colon: Secondary | ICD-10-CM

## 2023-07-20 LAB — CBC WITH DIFFERENTIAL/PLATELET
Basophils Absolute: 0.1 10*3/uL (ref 0.0–0.1)
Basophils Relative: 0.8 % (ref 0.0–3.0)
Eosinophils Absolute: 0.2 10*3/uL (ref 0.0–0.7)
Eosinophils Relative: 2.8 % (ref 0.0–5.0)
HCT: 47.9 % (ref 39.0–52.0)
Hemoglobin: 16 g/dL (ref 13.0–17.0)
Lymphocytes Relative: 38.2 % (ref 12.0–46.0)
Lymphs Abs: 2.6 10*3/uL (ref 0.7–4.0)
MCHC: 33.4 g/dL (ref 30.0–36.0)
MCV: 91.4 fl (ref 78.0–100.0)
Monocytes Absolute: 0.6 10*3/uL (ref 0.1–1.0)
Monocytes Relative: 9.2 % (ref 3.0–12.0)
Neutro Abs: 3.3 10*3/uL (ref 1.4–7.7)
Neutrophils Relative %: 49 % (ref 43.0–77.0)
Platelets: 201 10*3/uL (ref 150.0–400.0)
RBC: 5.24 Mil/uL (ref 4.22–5.81)
RDW: 13.4 % (ref 11.5–15.5)
WBC: 6.7 10*3/uL (ref 4.0–10.5)

## 2023-07-20 LAB — BASIC METABOLIC PANEL WITH GFR
BUN: 15 mg/dL (ref 6–23)
CO2: 28 meq/L (ref 19–32)
Calcium: 10.2 mg/dL (ref 8.4–10.5)
Chloride: 98 meq/L (ref 96–112)
Creatinine, Ser: 1.27 mg/dL (ref 0.40–1.50)
GFR: 67.97 mL/min (ref >60.00)
Glucose, Bld: 103 mg/dL — ABNORMAL HIGH (ref 70–99)
Potassium: 4.3 meq/L (ref 3.5–5.1)
Sodium: 136 meq/L (ref 135–145)

## 2023-07-20 MED ORDER — NA SULFATE-K SULFATE-MG SULF 17.5-3.13-1.6 GM/177ML PO SOLN: 1.0000 | Freq: Once | ORAL | 0 refills | Status: AC

## 2023-07-20 NOTE — Patient Instructions (Addendum)
 Recommend high fiber diet No straining or pushing with bowel movements  Your provider has requested that you go to the basement level for lab work before leaving today. Press B on the elevator. The lab is located at the first door on the left as you exit the elevator.   You have been scheduled for a colonoscopy. Please follow written instructions given to you at your visit today.   If you use inhalers (even only as needed), please bring them with you on the day of your procedure.  DO NOT TAKE 7 DAYS PRIOR TO TEST- Trulicity (dulaglutide) Ozempic, Wegovy (semaglutide) Mounjaro (tirzepatide) Bydureon Bcise (exanatide extended release)  DO NOT TAKE 1 DAY PRIOR TO YOUR TEST Rybelsus (semaglutide) Adlyxin (lixisenatide) Victoza (liraglutide) Byetta (exanatide) ___________________________________________________________________________  Due to recent changes in healthcare laws, you may see the results of your imaging and laboratory studies on MyChart before your provider has had a chance to review them.  We understand that in some cases there may be results that are confusing or concerning to you. Not all laboratory results come back in the same time frame and the provider may be waiting for multiple results in order to interpret others.  Please give us  48 hours in order for your provider to thoroughly review all the results before contacting the office for clarification of your results.   _______________________________________________________  If your blood pressure at your visit was 140/90 or greater, please contact your primary care physician to follow up on this.  _______________________________________________________  If you are age 46 or older, your body mass index should be between 23-30. Your Body mass index is 38.47 kg/m. If this is out of the aforementioned range listed, please consider follow up with your Primary Care Provider.  If you are age 33 or younger, your body mass  index should be between 19-25. Your Body mass index is 38.47 kg/m. If this is out of the aformentioned range listed, please consider follow up with your Primary Care Provider.   ________________________________________________________  The [facility] providers would like to encourage you to use MYCHART to communicate with providers for non-urgent requests or questions.  Due to long hold times on the telephone, sending your provider a message by [facility] may be a faster and more efficient way to get a response.  Please allow 48 business hours for a response.  Please remember that this is for non-urgent requests.  _______________________________________________________  Thank you for trusting me with your gastrointestinal care. Deanna May, RNP

## 2023-07-20 NOTE — Progress Notes (Signed)
 Chief Complaint:blood in stool, colon screening Primary GI Doctor: Dr. Charlanne  HPI:  Patient is a 47 year old A. A. male patient with past medical history of CKD stage 2, hypertension, prediabetes, and Schizophrenia, who was self referred to me for a complaint of blood in stool .    Interval History    Patient presents to discuss colonoscopy and report intermittent rectal bleeding he has had several years. He denies diarrhea or constipation. He reports the rectal bleeding Khadijatou Borak last a day and then not occur for a couple of years. No rectal pain.  Patient denies abdominal pain. Patient denies nausea, vomiting, or weight loss  Patient smokes approx 15 cigarettes per day. No alcohol use.   No known family history of GI issues or CA.  Wt Readings from Last 3 Encounters:  07/20/23 253 lb (114.8 kg)  04/03/19 278 lb 9.6 oz (126.4 kg)  02/02/19 278 lb (126.1 kg)    Past Medical History:  Diagnosis Date   CKD (chronic kidney disease), stage II    Hypercholesteremia    Hypertension    Obesity    Pre-diabetes    Schizophrenia (HCC)     Past Surgical History:  Procedure Laterality Date   ANKLE SURGERY     left   TESTICLE REMOVAL     Right    Current Outpatient Medications  Medication Sig Dispense Refill   amLODipine (NORVASC) 10 MG tablet Take 10 mg by mouth daily.  3   hydrochlorothiazide (HYDRODIURIL) 12.5 MG tablet Take 12.5 mg by mouth every morning.     lisinopril (PRINIVIL,ZESTRIL) 40 MG tablet Take 40 mg by mouth daily.  3   risperidone (RISPERDAL) 4 MG tablet      spironolactone  (ALDACTONE ) 50 MG tablet Take 1 tablet (50 mg total) by mouth daily. Please make overdue appt with Dr. Jeffrie before anymore refills. Thank you 1st attempt 30 tablet 0   doxazosin (CARDURA) 4 MG tablet Take 4 mg by mouth at bedtime. (Patient not taking: Reported on 07/20/2023)     rosuvastatin  (CRESTOR ) 20 MG tablet TAKE 1 TABLET BY MOUTH EVERY DAY (Patient not taking: Reported on 07/20/2023) 90 tablet  3   No current facility-administered medications for this visit.    Allergies as of 07/20/2023   (No Known Allergies)    Family History  Problem Relation Age of Onset   Healthy Son     Review of Systems:    Constitutional: No weight loss, fever, chills, weakness or fatigue HEENT: Eyes: No change in vision               Ears, Nose, Throat:  No change in hearing or congestion Skin: No rash or itching Cardiovascular: No chest pain, chest pressure or palpitations   Respiratory: No SOB or cough Gastrointestinal: See HPI and otherwise negative Genitourinary: No dysuria or change in urinary frequency Neurological: No headache, dizziness or syncope Musculoskeletal: No new muscle or joint pain Hematologic: No bleeding or bruising Psychiatric: No history of depression or anxiety    Physical Exam:  Vital signs: BP 136/78   Pulse 82   Ht 5' 8 (1.727 m)   Wt 253 lb (114.8 kg)   SpO2 97%   BMI 38.47 kg/m   Constitutional:   Pleasant A.A. male appears to be in NAD, Well developed, Well nourished, alert and cooperative Throat: Oral cavity and pharynx without inflammation, swelling or lesion.  Respiratory: Respirations even and unlabored. Lungs clear to auscultation bilaterally.   No wheezes,  crackles, or rhonchi.  Cardiovascular: Normal S1, S2. Regular rate and rhythm. No peripheral edema, cyanosis or pallor.  Gastrointestinal:  Soft, nondistended, nontender. No rebound or guarding. Normal bowel sounds. No appreciable masses or hepatomegaly. Rectal:  Not performed. Declined. Msk:  Symmetrical without gross deformities. Without edema, no deformity or joint abnormality.  Neurologic:  Alert and  oriented x4;  grossly normal neurologically.  Skin:   Dry and intact without significant lesions or rashes. Psychiatric: Oriented to person, place and time. Demonstrates good judgement and reason without abnormal affect or behaviors.  RELEVANT LABS AND IMAGING: CBC    Latest Ref Rng & Units  01/09/2014    8:24 AM 04/13/2009    6:59 PM  CBC  WBC 4.0 - 10.5 K/uL 5.8    Hemoglobin 13.0 - 17.0 g/dL 84.1  82.6   Hematocrit 39.0 - 52.0 % 47.3  51.0   Platelets 150 - 400 K/uL 249       CMP     Latest Ref Rng & Units 03/19/2019    4:13 PM 02/09/2019    2:50 PM 02/02/2019   11:40 AM  CMP  Glucose 65 - 99 mg/dL 96  864  89   BUN 6 - 24 mg/dL 5  5  7    Creatinine 0.76 - 1.27 mg/dL 8.88  9.00  8.99   Sodium 134 - 144 mmol/L 141  145  144   Potassium 3.5 - 5.2 mmol/L 4.2  3.9  3.7   Chloride 96 - 106 mmol/L 102  103  104   CO2 20 - 29 mmol/L 26  26  25    Calcium  8.7 - 10.2 mg/dL 9.3  9.5  9.5      Lab Results  Component Value Date   TSH 1.270 02/15/2019     Assessment: Encounter Diagnoses  Name Primary?   Special screening for malignant neoplasms, colon Yes   Rectal bleeding   48 year old A.A. male patient who presents to schedule colon screening colonoscopy for surveillance as well as to evaluate for intermittent rectal bleeding. Will schedule with Dr. Charlanne in [facility]. We discussed high fiber diet and no straining with BM's.   Plan: - check BMET, CBC today -Recommend high fiber diet - Schedule for a colonoscopy in [facility] with Dr.Gupta. The risks and benefits of colonoscopy with possible polypectomy / biopsies were discussed and the patient agrees to proceed.    Thank you for the courtesy of this consult. Please call me with any questions or concerns.   Joyce Leckey, FNP-C [facility] 07/20/2023, 9:41 AM  Cc: Husain, Karrar, MD

## 2023-09-02 ENCOUNTER — Other Ambulatory Visit

## 2023-09-19 ENCOUNTER — Other Ambulatory Visit: Payer: Self-pay | Admitting: Urology

## 2023-09-19 DIAGNOSIS — Z87438 Personal history of other diseases of male genital organs: Secondary | ICD-10-CM

## 2023-10-04 ENCOUNTER — Ambulatory Visit: Admitting: Gastroenterology

## 2023-10-04 ENCOUNTER — Encounter: Payer: Self-pay | Admitting: Gastroenterology

## 2023-10-04 VITALS — BP 122/77 | HR 75 | Temp 98.2°F | Resp 12 | Ht 68.0 in | Wt 253.0 lb

## 2023-10-04 DIAGNOSIS — R7303 Prediabetes: Secondary | ICD-10-CM | POA: Diagnosis not present

## 2023-10-04 DIAGNOSIS — Z1211 Encounter for screening for malignant neoplasm of colon: Secondary | ICD-10-CM

## 2023-10-04 DIAGNOSIS — K640 First degree hemorrhoids: Secondary | ICD-10-CM | POA: Diagnosis not present

## 2023-10-04 DIAGNOSIS — D125 Benign neoplasm of sigmoid colon: Secondary | ICD-10-CM

## 2023-10-04 DIAGNOSIS — K5730 Diverticulosis of large intestine without perforation or abscess without bleeding: Secondary | ICD-10-CM

## 2023-10-04 DIAGNOSIS — K635 Polyp of colon: Secondary | ICD-10-CM | POA: Diagnosis not present

## 2023-10-04 MED ORDER — SODIUM CHLORIDE 0.9 % IV SOLN: 500.0000 mL | Freq: Once | INTRAVENOUS | Status: DC

## 2023-10-04 NOTE — Progress Notes (Signed)
 Called to room to assist during endoscopic procedure.  Patient ID and intended procedure confirmed with present staff. Received instructions for my participation in the procedure from the performing physician.

## 2023-10-04 NOTE — Progress Notes (Signed)
 Report to PACU, RN, vss, BBS= Clear.

## 2023-10-04 NOTE — Op Note (Signed)
 [facility] Patient Name: Eddie Simmons Procedure Date: 10/04/2023 10:08 AM MRN: [id number] Endoscopist: Lynnie Bring , MD, [id number] Age: 48 Referring MD:  Date of Birth: 1975/12/22 Gender: Male Account #: [id number] Procedure:                Colonoscopy Indications:              Screening for colorectal malignant neoplasm Medicines:                Monitored Anesthesia Care Procedure:                Pre-Anesthesia Assessment:                           - Prior to the procedure, a History and Physical                            was performed, and patient medications and                            allergies were reviewed. The patient's tolerance of                            previous anesthesia was also reviewed. The risks                            and benefits of the procedure and the sedation                            options and risks were discussed with the patient.                            All questions were answered, and informed consent                            was obtained. Prior Anticoagulants: The patient has                            taken no anticoagulant or antiplatelet agents. ASA                            Grade Assessment: III - A patient with severe                            systemic disease. After reviewing the risks and                            benefits, the patient was deemed in satisfactory                            condition to undergo the procedure.                           After obtaining informed consent, the colonoscope  was passed under direct vision. Throughout the                            procedure, the patient's blood pressure, pulse, and                            oxygen saturations were monitored continuously. The                            Olympus CF-HQ190L ([id number]) Colonoscope was                            introduced through the anus and advanced to the 4                            cm into the ileum.  The colonoscopy was performed                            without difficulty. The patient tolerated the                            procedure well. The quality of the bowel                            preparation was adequate to identify polyps. Some                            retained stool/brownish fluid in some areas of the                            colon making it harder to visualize. Nevertheless,                            aggressive suctioning and aspiration was performed.                            Overall over 90-95% of the colonic mucosa                            visualized satisfactorily. The terminal ileum,                            ileocecal valve, appendiceal orifice, and rectum                            were photographed. Scope In: 10:13:20 AM Scope Out: 10:34:27 AM Scope Withdrawal Time: 0 hours 17 minutes 19 seconds  Total Procedure Duration: 0 hours 21 minutes 7 seconds  Findings:                 A 10 mm polyp was found in the mid sigmoid colon,                            30 cm from the anal verge. The polyp  was                            semi-pedunculated. The polyp was removed with a hot                            snare. Resection and retrieval were complete.                           A 4 mm polyp was found in the distal sigmoid colon.                            The polyp was sessile. The polyp was removed with a                            cold snare. Resection and retrieval were complete.                           Many small-mouthed diverticula were found in the                            sigmoid colon and transverse colon.                           Non-bleeding internal hemorrhoids were found during                            retroflexion and during perianal exam. The                            hemorrhoids were moderate and Grade I (internal                            hemorrhoids that do not prolapse).                           The terminal ileum appeared normal.                            Retroflexion in the right colon was performed.                           The exam was otherwise without abnormality on                            direct and retroflexion views. Complications:            No immediate complications. Estimated Blood Loss:     Estimated blood loss: none. Impression:               - One 10 mm polyp in the mid sigmoid colon, removed                            with a hot snare. Resected and retrieved.                           -  One 4 mm polyp in the distal sigmoid colon,                            removed with a cold snare. Resected and retrieved.                           - Mild predominantly sigmoid diverticulosis.                           - Non-bleeding internal hemorrhoids.                           - The examined portion of the ileum was normal.                           - The examination was otherwise normal on direct                            and retroflexion views. Recommendation:           - Patient has a contact number available for                            emergencies. The signs and symptoms of potential                            delayed complications were discussed with the                            patient. Return to normal activities tomorrow.                            Written discharge instructions were provided to the                            patient.                           - Resume previous diet.                           - Continue present medications.                           - Await pathology results.                           - Repeat colonoscopy for surveillance depending                            upon the above biopsy results with a 2-day prep.                           - If any further rectal bleeding, use Preparation H  1 twice daily after the bowel movement for 7 to 10                            days.                           - Return to GI clinic PRN. Lynnie Bring,  MD 10/04/2023 10:41:14 AM This report has been signed electronically.

## 2023-10-04 NOTE — Patient Instructions (Signed)

## 2023-10-04 NOTE — Progress Notes (Signed)
 Patient denies kidney disease as listed in history

## 2023-10-04 NOTE — Progress Notes (Signed)
 [facility] History and Physical   Primary Care Physician:  Ransom Other, MD   Reason for Procedure:   CRC screening  Plan:    colon     HPI: Eddie Simmons is a 48 y.o. male    Past Medical History:  Diagnosis Date   CKD (chronic kidney disease), stage II    Hypercholesteremia    Hypertension    Obesity    Pre-diabetes    Schizophrenia (HCC)     Past Surgical History:  Procedure Laterality Date   ANKLE SURGERY     left   TESTICLE REMOVAL     Right    Prior to Admission medications   Medication Sig Start Date End Date Taking? Authorizing Provider  amLODipine (NORVASC) 10 MG tablet Take 10 mg by mouth daily. 08/21/17  Yes [provider]  hydrochlorothiazide (HYDRODIURIL) 12.5 MG tablet Take 12.5 mg by mouth every morning. 10/19/18  Yes [provider]  lisinopril (PRINIVIL,ZESTRIL) 40 MG tablet Take 40 mg by mouth daily. 12/17/13  Yes [provider]  risperidone (RISPERDAL) 4 MG tablet  09/07/17  Yes [provider]  rosuvastatin  (CRESTOR ) 20 MG tablet TAKE 1 TABLET BY MOUTH EVERY DAY 01/28/20  Yes Jeffrie Oneil BROCKS, MD  spironolactone  (ALDACTONE ) 50 MG tablet Take 1 tablet (50 mg total) by mouth daily. Please make overdue appt with Dr. Jeffrie before anymore refills. Thank you 1st attempt 03/24/20  Yes Jeffrie Oneil BROCKS, MD  doxazosin (CARDURA) 4 MG tablet Take 4 mg by mouth at bedtime. Patient not taking: No sig reported 10/19/18   [provider]    Current Outpatient Medications  Medication Sig Dispense Refill   amLODipine (NORVASC) 10 MG tablet Take 10 mg by mouth daily.  3   hydrochlorothiazide (HYDRODIURIL) 12.5 MG tablet Take 12.5 mg by mouth every morning.     lisinopril (PRINIVIL,ZESTRIL) 40 MG tablet Take 40 mg by mouth daily.  3   risperidone (RISPERDAL) 4 MG tablet      rosuvastatin  (CRESTOR ) 20 MG tablet TAKE 1 TABLET BY MOUTH EVERY DAY 90 tablet 3   spironolactone  (ALDACTONE ) 50 MG tablet Take 1 tablet  (50 mg total) by mouth daily. Please make overdue appt with Dr. Jeffrie before anymore refills. Thank you 1st attempt 30 tablet 0   doxazosin (CARDURA) 4 MG tablet Take 4 mg by mouth at bedtime. (Patient not taking: No sig reported)     Current Facility-Administered Medications  Medication Dose Route Frequency Provider Last Rate Last Admin   0.9 %  sodium chloride  infusion  500 mL Intravenous Once Charlanne Groom, MD        Allergies as of 10/04/2023   (No Known Allergies)    Family History  Problem Relation Age of Onset   Healthy Son     Social History   Socioeconomic History   Marital status: Single    Spouse name: Not on file   Number of children: Not on file   Years of education: Not on file   Highest education level: Not on file  Occupational History   Not on file  Tobacco Use   Smoking status: Every Day   Smokeless tobacco: Never  Substance and Sexual Activity   Alcohol use: No   Drug use: No   Sexual activity: Not on file  Other Topics Concern   Not on file  Social History Narrative   Not on file   Social Drivers of Health   Financial Resource Strain: Not on file  Food Insecurity: Not on file  Transportation Needs: Not on file  Physical Activity: Not on file  Stress: Not on file  Social Connections: Not on file  Intimate Partner Violence: Not on file    Review of Systems: Positive for none All other review of systems negative except as mentioned in the HPI.  Physical Exam: Vital signs in last 24 hours: @VSRANGES @   General:   Alert,  Well-developed, well-nourished, pleasant and cooperative in NAD Lungs:  Clear throughout to auscultation.   Heart:  Regular rate and rhythm; no murmurs, clicks, rubs,  or gallops. Abdomen:  Soft, nontender and nondistended. Normal bowel sounds.   Neuro/Psych:  Alert and cooperative. Normal mood and affect. A and O x 3    No significant changes were identified.  The patient continues to be an appropriate candidate for  the planned procedure and anesthesia.   Anselm Bring, MD. [facility] 10/04/2023 10:06 AM@

## 2023-10-05 ENCOUNTER — Telehealth: Payer: Self-pay | Admitting: *Deleted

## 2023-10-05 NOTE — Telephone Encounter (Signed)
  Follow up Call-     10/04/2023    9:45 AM  Call back number  Post procedure Call Back phone  # [phone]  Permission to leave phone message Yes     Patient questions:  Do you have a fever, pain , or abdominal swelling? No. Pain Score  0 *  Have you tolerated food without any problems? Yes.    Have you been able to return to your normal activities? Yes.    Do you have any questions about your discharge instructions: Diet   No. Medications  No. Follow up visit  No.  Do you have questions or concerns about your Care? No.  Actions: * If pain score is 4 or above: No action needed, pain <4.

## 2023-10-06 ENCOUNTER — Ambulatory Visit
Admission: RE | Admit: 2023-10-06 | Discharge: 2023-10-06 | Disposition: A | Discharge status: Home / Self Care | Source: Ambulatory Visit | Attending: Urology | Admitting: Urology

## 2023-10-06 DIAGNOSIS — Z87438 Personal history of other diseases of male genital organs: Secondary | ICD-10-CM

## 2023-10-06 LAB — SURGICAL PATHOLOGY

## 2023-10-09 ENCOUNTER — Ambulatory Visit: Payer: Self-pay | Admitting: Gastroenterology
